# Patient Record
Sex: Female | Born: 1970 | Race: Black or African American | Hispanic: No | Marital: Single | Smoking: Former smoker
Health system: Southern US, Community
[De-identification: ages and names within clinical notes are randomized; demographics above are authoritative.]

## PROBLEM LIST (undated history)

## (undated) DIAGNOSIS — F32A Depression, unspecified: Secondary | ICD-10-CM

## (undated) DIAGNOSIS — R0600 Dyspnea, unspecified: Secondary | ICD-10-CM

## (undated) DIAGNOSIS — F329 Major depressive disorder, single episode, unspecified: Secondary | ICD-10-CM

## (undated) DIAGNOSIS — R7303 Prediabetes: Secondary | ICD-10-CM

## (undated) DIAGNOSIS — I10 Essential (primary) hypertension: Secondary | ICD-10-CM

## (undated) DIAGNOSIS — R519 Headache, unspecified: Secondary | ICD-10-CM

## (undated) DIAGNOSIS — F419 Anxiety disorder, unspecified: Secondary | ICD-10-CM

## (undated) DIAGNOSIS — Z889 Allergy status to unspecified drugs, medicaments and biological substances status: Secondary | ICD-10-CM

## (undated) DIAGNOSIS — E785 Hyperlipidemia, unspecified: Secondary | ICD-10-CM

## (undated) DIAGNOSIS — R002 Palpitations: Secondary | ICD-10-CM

## (undated) DIAGNOSIS — M5030 Other cervical disc degeneration, unspecified cervical region: Secondary | ICD-10-CM

## (undated) HISTORY — PX: WISDOM TOOTH EXTRACTION: SHX21

## (undated) HISTORY — PX: ORBITAL FRACTURE SURGERY: SHX725

## (undated) HISTORY — PX: BACK SURGERY: SHX140

## (undated) HISTORY — PX: CERVICAL DISCECTOMY: SHX98

## (undated) HISTORY — PX: UTERINE FIBROID SURGERY: SHX826

## (undated) HISTORY — PX: LUMBAR FUSION: SHX111

---

## 2017-10-25 ENCOUNTER — Other Ambulatory Visit: Payer: Self-pay | Admitting: Orthopedic Surgery

## 2017-10-31 ENCOUNTER — Encounter (HOSPITAL_COMMUNITY): Payer: Self-pay | Admitting: *Deleted

## 2017-10-31 NOTE — Progress Notes (Signed)
Reports that she is having palpitations for ~ 6 months without associated CP or Shob. Per patient and in PCP listed history she has a history of same. Denies cardiac workup. Anesthesia notified.

## 2017-10-31 NOTE — Progress Notes (Signed)
Anesthesia Chart Review: SAME DAY WORK-UP  Case:  [id number] Date/Time:  11/01/17 1415   Procedure:  POSTERIOR CERVICAL DECOMPRESSION FUSION, CERVICAL 3-4, CERVICAL 4-5, CERVICAL 5-6, CERVICAL 6-7 WITH INSTRUMENTATION AND ALLOGRAFT (N/A )   Anesthesia type:  General   Pre-op diagnosis:  NECK PAIN SECONDARY TO ADJACENT SEGMENT DISC DEGENERATION   Location:  [facility] ROOM 05 / [facility]   Surgeon:  Estill Bamberg, MD      DISCUSSION: Patient is a 47 year old female scheduled for the above procedure.  History includes recent former smoker (quit 09/24/17), HTN, palpitations, C4-7 ACDF '17, L4-5 laminectomy '16.  She denied chest pain and SOB and did not report significant symptoms related to her palpitations. She will get an EKG on arrival due to history of HTN and palpitations. If no worrisome findings and same day labs acceptable then I would anticipate that she can proceed as planned.    VS: Wt 103.9 kg Comment: 3.1.19  LMP 10/26/2017   PROVIDERS: Patient seen by Syliva Overman, PA at [facility] on 03/24/17 for annual exam. BP 122/82 at that time. She was on losartan-HCTZ  LABS: She will get labs on arrival. (Last labs 03/24/17 in [facility].)   EKG: To get day of surgery.    CV: N/A  Past Medical History:  Diagnosis Date  . DDD (degenerative disc disease), cervical   . Depression   . Hypertension   . Palpitations     Past Surgical History:  Procedure Laterality Date  . CERVICAL DISCECTOMY    . LUMBAR FUSION    . UTERINE FIBROID SURGERY      MEDICATIONS: No current facility-administered medications for this encounter.    . cholecalciferol (VITAMIN D) 1000 units tablet  . citalopram (CELEXA) 40 MG tablet  . fluticasone (FLONASE) 50 MCG/ACT nasal spray  . losartan-hydrochlorothiazide (HYZAAR) 50-12.5 MG tablet  . Specialty Vitamins Products (VITAMINS FOR HAIR PO)  . vitamin B-12 (CYANOCOBALAMIN) 1000 MCG tablet  . vitamin E 400 UNIT capsule     Velna Ochs [facility] Short Stay Center/Anesthesiology Phone [phone] 10/31/2017 3:34 PM

## 2017-11-01 ENCOUNTER — Ambulatory Visit (HOSPITAL_COMMUNITY): Payer: No Typology Code available for payment source

## 2017-11-01 ENCOUNTER — Ambulatory Visit (HOSPITAL_COMMUNITY)
Admission: RE | Disposition: A | Discharge status: Home / Self Care | Payer: Self-pay | Source: Ambulatory Visit | Attending: Orthopedic Surgery

## 2017-11-01 ENCOUNTER — Observation Stay (HOSPITAL_COMMUNITY)
Admission: RE | Admit: 2017-11-01 | Discharge: 2017-11-02 | Disposition: A | Discharge status: Home / Self Care | Payer: No Typology Code available for payment source | Source: Ambulatory Visit | Attending: Orthopedic Surgery | Admitting: Orthopedic Surgery

## 2017-11-01 ENCOUNTER — Ambulatory Visit (HOSPITAL_COMMUNITY): Payer: No Typology Code available for payment source | Admitting: Vascular Surgery

## 2017-11-01 ENCOUNTER — Encounter (HOSPITAL_COMMUNITY): Payer: Self-pay | Admitting: *Deleted

## 2017-11-01 DIAGNOSIS — M5410 Radiculopathy, site unspecified: Secondary | ICD-10-CM | POA: Diagnosis present

## 2017-11-01 DIAGNOSIS — Y838 Other surgical procedures as the cause of abnormal reaction of the patient, or of later complication, without mention of misadventure at the time of the procedure: Secondary | ICD-10-CM | POA: Insufficient documentation

## 2017-11-01 DIAGNOSIS — F419 Anxiety disorder, unspecified: Secondary | ICD-10-CM | POA: Insufficient documentation

## 2017-11-01 DIAGNOSIS — F17200 Nicotine dependence, unspecified, uncomplicated: Secondary | ICD-10-CM | POA: Diagnosis not present

## 2017-11-01 DIAGNOSIS — Z79899 Other long term (current) drug therapy: Secondary | ICD-10-CM | POA: Insufficient documentation

## 2017-11-01 DIAGNOSIS — F329 Major depressive disorder, single episode, unspecified: Secondary | ICD-10-CM | POA: Diagnosis not present

## 2017-11-01 DIAGNOSIS — M5011 Cervical disc disorder with radiculopathy,  high cervical region: Secondary | ICD-10-CM | POA: Insufficient documentation

## 2017-11-01 DIAGNOSIS — I10 Essential (primary) hypertension: Secondary | ICD-10-CM | POA: Insufficient documentation

## 2017-11-01 DIAGNOSIS — M960 Pseudarthrosis after fusion or arthrodesis: Secondary | ICD-10-CM | POA: Diagnosis not present

## 2017-11-01 DIAGNOSIS — Z419 Encounter for procedure for purposes other than remedying health state, unspecified: Secondary | ICD-10-CM

## 2017-11-01 DIAGNOSIS — K219 Gastro-esophageal reflux disease without esophagitis: Secondary | ICD-10-CM | POA: Insufficient documentation

## 2017-11-01 DIAGNOSIS — R002 Palpitations: Secondary | ICD-10-CM | POA: Insufficient documentation

## 2017-11-01 DIAGNOSIS — M50321 Other cervical disc degeneration at C4-C5 level: Secondary | ICD-10-CM | POA: Diagnosis present

## 2017-11-01 DIAGNOSIS — Z6838 Body mass index (BMI) 38.0-38.9, adult: Secondary | ICD-10-CM | POA: Diagnosis not present

## 2017-11-01 HISTORY — DX: Other cervical disc degeneration, unspecified cervical region: M50.30

## 2017-11-01 HISTORY — DX: Major depressive disorder, single episode, unspecified: F32.9

## 2017-11-01 HISTORY — DX: Depression, unspecified: F32.A

## 2017-11-01 HISTORY — DX: Palpitations: R00.2

## 2017-11-01 HISTORY — DX: Essential (primary) hypertension: I10

## 2017-11-01 HISTORY — PX: POSTERIOR CERVICAL FUSION/FORAMINOTOMY: SHX5038

## 2017-11-01 LAB — TYPE AND SCREEN
ABO/RH(D): O POS
ANTIBODY SCREEN: NEGATIVE

## 2017-11-01 LAB — CBC WITH DIFFERENTIAL/PLATELET
Abs Immature Granulocytes: 0.01 10*3/uL (ref 0.00–0.07)
Basophils Absolute: 0 10*3/uL (ref 0.0–0.1)
Basophils Relative: 1 %
EOS ABS: 0.2 10*3/uL (ref 0.0–0.5)
Eosinophils Relative: 3 %
HCT: 40.4 % (ref 36.0–46.0)
Hemoglobin: 12.3 g/dL (ref 12.0–15.0)
Immature Granulocytes: 0 %
Lymphocytes Relative: 51 %
Lymphs Abs: 2.9 10*3/uL (ref 0.7–4.0)
MCH: 27 pg (ref 26.0–34.0)
MCHC: 30.4 g/dL (ref 30.0–36.0)
MCV: 88.6 fL (ref 80.0–100.0)
MONO ABS: 0.4 10*3/uL (ref 0.1–1.0)
MONOS PCT: 6 %
NEUTROS PCT: 39 %
Neutro Abs: 2.2 10*3/uL (ref 1.7–7.7)
Platelets: 307 10*3/uL (ref 150–400)
RBC: 4.56 MIL/uL (ref 3.87–5.11)
RDW: 13.8 % (ref 11.5–15.5)
WBC: 5.8 10*3/uL (ref 4.0–10.5)
nRBC: 0 % (ref 0.0–0.2)

## 2017-11-01 LAB — COMPREHENSIVE METABOLIC PANEL
ALT: 21 U/L (ref 0–44)
AST: 22 U/L (ref 15–41)
Albumin: 3.9 g/dL (ref 3.5–5.0)
Alkaline Phosphatase: 74 U/L (ref 38–126)
Anion gap: 10 (ref 5–15)
BUN: 11 mg/dL (ref 6–20)
CHLORIDE: 108 mmol/L (ref 98–111)
CO2: 22 mmol/L (ref 22–32)
Calcium: 9.2 mg/dL (ref 8.9–10.3)
Creatinine, Ser: 0.67 mg/dL (ref 0.44–1.00)
Glucose, Bld: 99 mg/dL (ref 70–99)
POTASSIUM: 3.8 mmol/L (ref 3.5–5.1)
Sodium: 140 mmol/L (ref 135–145)
Total Bilirubin: 0.5 mg/dL (ref 0.3–1.2)
Total Protein: 7.8 g/dL (ref 6.5–8.1)

## 2017-11-01 LAB — ABO/RH: ABO/RH(D): O POS

## 2017-11-01 LAB — URINALYSIS, ROUTINE W REFLEX MICROSCOPIC
Bilirubin Urine: NEGATIVE
Glucose, UA: NEGATIVE mg/dL
Ketones, ur: NEGATIVE mg/dL
Leukocytes, UA: NEGATIVE
Nitrite: NEGATIVE
Protein, ur: NEGATIVE mg/dL
SPECIFIC GRAVITY, URINE: 1.027 (ref 1.005–1.030)
pH: 5 (ref 5.0–8.0)

## 2017-11-01 LAB — PROTIME-INR
INR: 0.98
PROTHROMBIN TIME: 12.9 s (ref 11.4–15.2)

## 2017-11-01 LAB — POCT PREGNANCY, URINE: PREG TEST UR: NEGATIVE

## 2017-11-01 LAB — APTT: APTT: 29 s (ref 24–36)

## 2017-11-01 SURGERY — POSTERIOR CERVICAL FUSION/FORAMINOTOMY LEVEL 4
Anesthesia: General | Site: Neck

## 2017-11-01 MED ORDER — VITAMIN E 180 MG (400 UNIT) PO CAPS
400.0000 [IU] | ORAL_CAPSULE | Freq: Every day | ORAL | Status: DC
  Filled 2017-11-01: qty 1

## 2017-11-01 MED ORDER — SODIUM CHLORIDE 0.9 % IV SOLN
INTRAVENOUS | Status: DC | PRN
  Administered 2017-11-01: 25 ug/min via INTRAVENOUS

## 2017-11-01 MED ORDER — LACTATED RINGERS IV SOLN
INTRAVENOUS | Status: DC | PRN
  Administered 2017-11-01 (×2): via INTRAVENOUS

## 2017-11-01 MED ORDER — FENTANYL CITRATE (PF) 250 MCG/5ML IJ SOLN
INTRAMUSCULAR | Status: AC
  Filled 2017-11-01: qty 5

## 2017-11-01 MED ORDER — FLEET ENEMA 7-19 GM/118ML RE ENEM: 1.0000 | ENEMA | Freq: Once | RECTAL | Status: DC | PRN

## 2017-11-01 MED ORDER — SUCCINYLCHOLINE CHLORIDE 200 MG/10ML IV SOSY
PREFILLED_SYRINGE | INTRAVENOUS | Status: AC
  Filled 2017-11-01: qty 10

## 2017-11-01 MED ORDER — HYDROMORPHONE HCL 1 MG/ML IJ SOLN
INTRAMUSCULAR | Status: AC
  Filled 2017-11-01: qty 1

## 2017-11-01 MED ORDER — BISACODYL 5 MG PO TBEC: 5.0000 mg | DELAYED_RELEASE_TABLET | Freq: Every day | ORAL | Status: DC | PRN

## 2017-11-01 MED ORDER — CITALOPRAM HYDROBROMIDE 20 MG PO TABS: 40.0000 mg | ORAL_TABLET | Freq: Every day | ORAL | Status: DC

## 2017-11-01 MED ORDER — WHITE PETROLATUM EX OINT
TOPICAL_OINTMENT | CUTANEOUS | Status: AC
  Administered 2017-11-02
  Filled 2017-11-01: qty 28.35

## 2017-11-01 MED ORDER — ALBUTEROL SULFATE HFA 108 (90 BASE) MCG/ACT IN AERS
INHALATION_SPRAY | RESPIRATORY_TRACT | Status: DC | PRN
  Administered 2017-11-01: 8 via RESPIRATORY_TRACT
  Administered 2017-11-01: 2 via RESPIRATORY_TRACT

## 2017-11-01 MED ORDER — CEFAZOLIN SODIUM-DEXTROSE 2-4 GM/100ML-% IV SOLN
2.0000 g | INTRAVENOUS | Status: AC
  Administered 2017-11-01 (×2): 2 g via INTRAVENOUS

## 2017-11-01 MED ORDER — SUCCINYLCHOLINE CHLORIDE 200 MG/10ML IV SOSY
PREFILLED_SYRINGE | INTRAVENOUS | Status: DC | PRN
  Administered 2017-11-01: 140 mg via INTRAVENOUS

## 2017-11-01 MED ORDER — PROMETHAZINE HCL 25 MG/ML IJ SOLN
INTRAMUSCULAR | Status: AC
  Administered 2017-11-01: 19:00:00
  Filled 2017-11-01: qty 1

## 2017-11-01 MED ORDER — FLUTICASONE PROPIONATE 50 MCG/ACT NA SUSP
1.0000 | Freq: Every day | NASAL | Status: DC | PRN
  Filled 2017-11-01: qty 16

## 2017-11-01 MED ORDER — SODIUM CHLORIDE 0.9% FLUSH: 3.0000 mL | INTRAVENOUS | Status: DC | PRN

## 2017-11-01 MED ORDER — OXYCODONE-ACETAMINOPHEN 5-325 MG PO TABS
1.0000 | ORAL_TABLET | ORAL | Status: DC | PRN
  Administered 2017-11-01 – 2017-11-02 (×4): 2 via ORAL
  Filled 2017-11-01 (×3): qty 2

## 2017-11-01 MED ORDER — DIAZEPAM 5 MG PO TABS
ORAL_TABLET | ORAL | Status: AC
  Administered 2017-11-01: 20:00:00
  Filled 2017-11-01: qty 1

## 2017-11-01 MED ORDER — LOSARTAN POTASSIUM 50 MG PO TABS: 50.0000 mg | ORAL_TABLET | Freq: Every day | ORAL | Status: DC

## 2017-11-01 MED ORDER — CEFAZOLIN SODIUM-DEXTROSE 2-4 GM/100ML-% IV SOLN
2.0000 g | Freq: Three times a day (TID) | INTRAVENOUS | Status: DC
  Administered 2017-11-02: 2 g via INTRAVENOUS
  Filled 2017-11-01: qty 100

## 2017-11-01 MED ORDER — ACETAMINOPHEN 650 MG RE SUPP: 650.0000 mg | RECTAL | Status: DC | PRN

## 2017-11-01 MED ORDER — SODIUM CHLORIDE 0.9% FLUSH: 3.0000 mL | Freq: Two times a day (BID) | INTRAVENOUS | Status: DC

## 2017-11-01 MED ORDER — 0.9 % SODIUM CHLORIDE (POUR BTL) OPTIME
TOPICAL | Status: DC | PRN
  Administered 2017-11-01: 1000 mL

## 2017-11-01 MED ORDER — DOCUSATE SODIUM 100 MG PO CAPS: 100.0000 mg | ORAL_CAPSULE | Freq: Two times a day (BID) | ORAL | Status: DC

## 2017-11-01 MED ORDER — BUPIVACAINE-EPINEPHRINE 0.25% -1:200000 IJ SOLN
INTRAMUSCULAR | Status: DC | PRN
  Administered 2017-11-01: 20 mL

## 2017-11-01 MED ORDER — PHENYLEPHRINE 40 MCG/ML (10ML) SYRINGE FOR IV PUSH (FOR BLOOD PRESSURE SUPPORT)
PREFILLED_SYRINGE | INTRAVENOUS | Status: DC | PRN
  Administered 2017-11-01: 120 ug via INTRAVENOUS

## 2017-11-01 MED ORDER — PROMETHAZINE HCL 25 MG/ML IJ SOLN
6.2500 mg | INTRAMUSCULAR | Status: DC | PRN
  Administered 2017-11-01: 6.25 mg via INTRAVENOUS

## 2017-11-01 MED ORDER — HYDROCHLOROTHIAZIDE 12.5 MG PO CAPS: 12.5000 mg | ORAL_CAPSULE | Freq: Every day | ORAL | Status: DC

## 2017-11-01 MED ORDER — MEPERIDINE HCL 50 MG/ML IJ SOLN: 6.2500 mg | INTRAMUSCULAR | Status: DC | PRN

## 2017-11-01 MED ORDER — BUPIVACAINE LIPOSOME 1.3 % IJ SUSP
20.0000 mL | INTRAMUSCULAR | Status: DC
  Filled 2017-11-01: qty 20

## 2017-11-01 MED ORDER — LACTATED RINGERS IV SOLN
INTRAVENOUS | Status: DC
  Administered 2017-11-01: 11:00:00 via INTRAVENOUS

## 2017-11-01 MED ORDER — BACITRACIN ZINC 500 UNIT/GM EX OINT
TOPICAL_OINTMENT | CUTANEOUS | Status: DC | PRN
  Administered 2017-11-01: 1 via TOPICAL

## 2017-11-01 MED ORDER — ONDANSETRON HCL 4 MG/2ML IJ SOLN
INTRAMUSCULAR | Status: DC | PRN
  Administered 2017-11-01: 4 mg via INTRAVENOUS

## 2017-11-01 MED ORDER — PROPOFOL 10 MG/ML IV BOLUS
INTRAVENOUS | Status: DC | PRN
  Administered 2017-11-01: 30 mg via INTRAVENOUS
  Administered 2017-11-01: 180 mg via INTRAVENOUS

## 2017-11-01 MED ORDER — ARTIFICIAL TEARS OPHTHALMIC OINT
TOPICAL_OINTMENT | OPHTHALMIC | Status: AC
  Filled 2017-11-01: qty 3.5

## 2017-11-01 MED ORDER — ROCURONIUM BROMIDE 50 MG/5ML IV SOSY
PREFILLED_SYRINGE | INTRAVENOUS | Status: AC
  Filled 2017-11-01: qty 5

## 2017-11-01 MED ORDER — MIDAZOLAM HCL 2 MG/2ML IJ SOLN
INTRAMUSCULAR | Status: DC | PRN
  Administered 2017-11-01 (×2): 1 mg via INTRAVENOUS

## 2017-11-01 MED ORDER — ALBUMIN HUMAN 5 % IV SOLN
INTRAVENOUS | Status: DC | PRN
  Administered 2017-11-01: 17:00:00 via INTRAVENOUS

## 2017-11-01 MED ORDER — MENTHOL 3 MG MT LOZG: 1.0000 | LOZENGE | OROMUCOSAL | Status: DC | PRN

## 2017-11-01 MED ORDER — ACETAMINOPHEN 325 MG PO TABS: 650.0000 mg | ORAL_TABLET | ORAL | Status: DC | PRN

## 2017-11-01 MED ORDER — OXYCODONE-ACETAMINOPHEN 5-325 MG PO TABS
ORAL_TABLET | ORAL | Status: AC
  Filled 2017-11-01: qty 2

## 2017-11-01 MED ORDER — MIDAZOLAM HCL 2 MG/2ML IJ SOLN: 0.5000 mg | Freq: Once | INTRAMUSCULAR | Status: DC | PRN

## 2017-11-01 MED ORDER — LIDOCAINE 2% (20 MG/ML) 5 ML SYRINGE
INTRAMUSCULAR | Status: AC
  Filled 2017-11-01: qty 5

## 2017-11-01 MED ORDER — ROCURONIUM BROMIDE 50 MG/5ML IV SOSY
PREFILLED_SYRINGE | INTRAVENOUS | Status: AC
  Filled 2017-11-01: qty 10

## 2017-11-01 MED ORDER — THROMBIN 20000 UNITS EX SOLR
CUTANEOUS | Status: DC | PRN
  Administered 2017-11-01: 20000 [IU] via TOPICAL

## 2017-11-01 MED ORDER — PHENYLEPHRINE 40 MCG/ML (10ML) SYRINGE FOR IV PUSH (FOR BLOOD PRESSURE SUPPORT)
PREFILLED_SYRINGE | INTRAVENOUS | Status: AC
  Filled 2017-11-01: qty 10

## 2017-11-01 MED ORDER — LIDOCAINE 2% (20 MG/ML) 5 ML SYRINGE
INTRAMUSCULAR | Status: DC | PRN
  Administered 2017-11-01: 40 mg via INTRAVENOUS

## 2017-11-01 MED ORDER — DEXAMETHASONE SODIUM PHOSPHATE 10 MG/ML IJ SOLN
INTRAMUSCULAR | Status: AC
  Filled 2017-11-01: qty 1

## 2017-11-01 MED ORDER — PANTOPRAZOLE SODIUM 20 MG PO TBEC: 20.0000 mg | DELAYED_RELEASE_TABLET | Freq: Two times a day (BID) | ORAL | Status: DC

## 2017-11-01 MED ORDER — LOSARTAN POTASSIUM-HCTZ 50-12.5 MG PO TABS: 1.0000 | ORAL_TABLET | Freq: Every day | ORAL | Status: DC

## 2017-11-01 MED ORDER — DEXAMETHASONE SODIUM PHOSPHATE 10 MG/ML IJ SOLN
INTRAMUSCULAR | Status: DC | PRN
  Administered 2017-11-01: 10 mg via INTRAVENOUS

## 2017-11-01 MED ORDER — VITAMIN B-12 1000 MCG PO TABS
1000.0000 ug | ORAL_TABLET | Freq: Every day | ORAL | Status: DC
  Filled 2017-11-01: qty 1

## 2017-11-01 MED ORDER — ROCURONIUM BROMIDE 10 MG/ML (PF) SYRINGE
PREFILLED_SYRINGE | INTRAVENOUS | Status: DC | PRN
  Administered 2017-11-01: 10 mg via INTRAVENOUS
  Administered 2017-11-01 (×3): 20 mg via INTRAVENOUS
  Administered 2017-11-01: 50 mg via INTRAVENOUS
  Administered 2017-11-01 (×2): 20 mg via INTRAVENOUS
  Administered 2017-11-01 (×2): 10 mg via INTRAVENOUS

## 2017-11-01 MED ORDER — VITAMIN D 1000 UNITS PO TABS: 1000.0000 [IU] | ORAL_TABLET | Freq: Every day | ORAL | Status: DC

## 2017-11-01 MED ORDER — DIAZEPAM 5 MG PO TABS
5.0000 mg | ORAL_TABLET | Freq: Four times a day (QID) | ORAL | Status: DC | PRN
  Administered 2017-11-01 – 2017-11-02 (×2): 5 mg via ORAL
  Filled 2017-11-01: qty 1

## 2017-11-01 MED ORDER — MIDAZOLAM HCL 2 MG/2ML IJ SOLN
INTRAMUSCULAR | Status: AC
  Filled 2017-11-01: qty 2

## 2017-11-01 MED ORDER — HYDROMORPHONE HCL 1 MG/ML IJ SOLN
0.2500 mg | INTRAMUSCULAR | Status: DC | PRN
  Administered 2017-11-01 (×4): 0.5 mg via INTRAVENOUS

## 2017-11-01 MED ORDER — THROMBIN (RECOMBINANT) 20000 UNITS EX SOLR
CUTANEOUS | Status: AC
  Filled 2017-11-01: qty 20000

## 2017-11-01 MED ORDER — SENNOSIDES-DOCUSATE SODIUM 8.6-50 MG PO TABS
1.0000 | ORAL_TABLET | Freq: Every evening | ORAL | Status: DC | PRN
  Filled 2017-11-01: qty 1

## 2017-11-01 MED ORDER — POVIDONE-IODINE 7.5 % EX SOLN
Freq: Once | CUTANEOUS | Status: DC
  Filled 2017-11-01: qty 118

## 2017-11-01 MED ORDER — SUGAMMADEX SODIUM 200 MG/2ML IV SOLN
INTRAVENOUS | Status: DC | PRN
  Administered 2017-11-01: 200 mg via INTRAVENOUS

## 2017-11-01 MED ORDER — PHENOL 1.4 % MT LIQD: 1.0000 | OROMUCOSAL | Status: DC | PRN

## 2017-11-01 MED ORDER — CEFAZOLIN SODIUM-DEXTROSE 2-4 GM/100ML-% IV SOLN
INTRAVENOUS | Status: AC
  Filled 2017-11-01: qty 100

## 2017-11-01 MED ORDER — ZOLPIDEM TARTRATE 5 MG PO TABS: 5.0000 mg | ORAL_TABLET | Freq: Every evening | ORAL | Status: DC | PRN

## 2017-11-01 MED ORDER — ONDANSETRON HCL 4 MG PO TABS: 4.0000 mg | ORAL_TABLET | Freq: Four times a day (QID) | ORAL | Status: DC | PRN

## 2017-11-01 MED ORDER — ALUM & MAG HYDROXIDE-SIMETH 200-200-20 MG/5ML PO SUSP: 30.0000 mL | Freq: Four times a day (QID) | ORAL | Status: DC | PRN

## 2017-11-01 MED ORDER — ALBUTEROL SULFATE HFA 108 (90 BASE) MCG/ACT IN AERS
INHALATION_SPRAY | RESPIRATORY_TRACT | Status: AC
  Filled 2017-11-01: qty 6.7

## 2017-11-01 MED ORDER — FENTANYL CITRATE (PF) 250 MCG/5ML IJ SOLN
INTRAMUSCULAR | Status: DC | PRN
  Administered 2017-11-01: 100 ug via INTRAVENOUS
  Administered 2017-11-01 (×3): 50 ug via INTRAVENOUS

## 2017-11-01 MED ORDER — ONDANSETRON HCL 4 MG/2ML IJ SOLN: 4.0000 mg | Freq: Four times a day (QID) | INTRAMUSCULAR | Status: DC | PRN

## 2017-11-01 MED ORDER — ONDANSETRON HCL 4 MG/2ML IJ SOLN
INTRAMUSCULAR | Status: AC
  Filled 2017-11-01: qty 2

## 2017-11-01 MED ORDER — BUPIVACAINE LIPOSOME 1.3 % IJ SUSP
INTRAMUSCULAR | Status: DC | PRN
  Administered 2017-11-01: 20 mL

## 2017-11-01 MED ORDER — SODIUM CHLORIDE 0.9 % IV SOLN: 250.0000 mL | INTRAVENOUS | Status: DC

## 2017-11-01 MED ORDER — BACITRACIN ZINC 500 UNIT/GM EX OINT
TOPICAL_OINTMENT | CUTANEOUS | Status: AC
  Filled 2017-11-01: qty 28.35

## 2017-11-01 MED ORDER — ARTIFICIAL TEARS OPHTHALMIC OINT
TOPICAL_OINTMENT | OPHTHALMIC | Status: DC | PRN
  Administered 2017-11-01: 1 via OPHTHALMIC

## 2017-11-01 MED ORDER — BUPIVACAINE-EPINEPHRINE (PF) 0.25% -1:200000 IJ SOLN
INTRAMUSCULAR | Status: AC
  Filled 2017-11-01: qty 30

## 2017-11-01 SURGICAL SUPPLY — 87 items
BENZOIN TINCTURE PRP APPL 2/3 (GAUZE/BANDAGES/DRESSINGS) ×3 IMPLANT
BIT DRILL MOUNTAINEER FIX 14 (BIT) ×1
BIT DRILL MOUNTAINEER FIX 14MM (BIT) ×1 IMPLANT
BIT DRILL MOUNTAINER FXED 12MM (DRILL) ×1 IMPLANT
BLADE CLIPPER SURG NEURO (BLADE) IMPLANT
BONE VIVIGEN FORMABLE 5.4CC (Bone Implant) ×3 IMPLANT
BUR NEURO DRILL SOFT 3.0X3.8M (BURR) ×3 IMPLANT
BUR PRESCISION 1.7 ELITE (BURR) ×3 IMPLANT
CLOSURE WOUND 1/2 X4 (GAUZE/BANDAGES/DRESSINGS)
CONT SPEC 4OZ CLIKSEAL STRL BL (MISCELLANEOUS) ×3 IMPLANT
CORDS BIPOLAR (ELECTRODE) ×3 IMPLANT
COVER BACK TABLE 80X110 HD (DRAPES) ×3 IMPLANT
COVER SURGICAL LIGHT HANDLE (MISCELLANEOUS) ×3 IMPLANT
COVER WAND RF STERILE (DRAPES) ×3 IMPLANT
DRAIN CHANNEL 10F 3/8 F FF (DRAIN) IMPLANT
DRAIN CHANNEL 15F RND FF W/TCR (WOUND CARE) IMPLANT
DRAIN HEMOVAC 1/8 X 5 (WOUND CARE) IMPLANT
DRAPE C-ARM 42X72 X-RAY (DRAPES) IMPLANT
DRAPE HALF SHEET 40X57 (DRAPES) ×15 IMPLANT
DRAPE INCISE IOBAN 66X45 STRL (DRAPES) ×3 IMPLANT
DRAPE PED LAPAROTOMY (DRAPES) ×3 IMPLANT
DRAPE POUCH INSTRU U-SHP 10X18 (DRAPES) ×3 IMPLANT
DRAPE SURG 17X23 STRL (DRAPES) ×24 IMPLANT
DRAPE UNIVERSAL PACK (DRAPES) ×3 IMPLANT
DRILL BIT MOUNTAINEER FIX 14MM (BIT) ×2
DRILL MOUNTAINEER FIXED 12MM (DRILL) ×3
DRSG MEPILEX BORDER 4X8 (GAUZE/BANDAGES/DRESSINGS) IMPLANT
DURAPREP 26ML APPLICATOR (WOUND CARE) ×3 IMPLANT
ELECT BLADE 4.0 EZ CLEAN MEGAD (MISCELLANEOUS) ×3
ELECT CAUTERY BLADE 6.4 (BLADE) ×3 IMPLANT
ELECT REM PT RETURN 9FT ADLT (ELECTROSURGICAL) ×3
ELECTRODE BLDE 4.0 EZ CLN MEGD (MISCELLANEOUS) ×1 IMPLANT
ELECTRODE REM PT RTRN 9FT ADLT (ELECTROSURGICAL) ×1 IMPLANT
EVACUATOR SILICONE 100CC (DRAIN) IMPLANT
GAUZE 4X4 16PLY RFD (DISPOSABLE) ×12 IMPLANT
GAUZE SPONGE 4X4 12PLY STRL (GAUZE/BANDAGES/DRESSINGS) ×3 IMPLANT
GLOVE BIO SURGEON STRL SZ7 (GLOVE) ×3 IMPLANT
GLOVE BIO SURGEON STRL SZ8 (GLOVE) ×3 IMPLANT
GLOVE BIOGEL PI IND STRL 7.5 (GLOVE) ×1 IMPLANT
GLOVE BIOGEL PI IND STRL 8 (GLOVE) ×1 IMPLANT
GLOVE BIOGEL PI INDICATOR 7.5 (GLOVE) ×2
GLOVE BIOGEL PI INDICATOR 8 (GLOVE) ×2
GOWN STRL REUS W/ TWL LRG LVL3 (GOWN DISPOSABLE) ×4 IMPLANT
GOWN STRL REUS W/ TWL XL LVL3 (GOWN DISPOSABLE) ×1 IMPLANT
GOWN STRL REUS W/TWL LRG LVL3 (GOWN DISPOSABLE) ×8
GOWN STRL REUS W/TWL XL LVL3 (GOWN DISPOSABLE) ×2
IV CATH 14GX2 1/4 (CATHETERS) ×3 IMPLANT
KIT BASIN OR (CUSTOM PROCEDURE TRAY) ×3 IMPLANT
KIT TURNOVER KIT B (KITS) ×3 IMPLANT
NEEDLE HYPO 25GX1X1/2 BEV (NEEDLE) ×3 IMPLANT
NEEDLE PRECISIONGLIDE 27X1.5 (NEEDLE) ×3 IMPLANT
NS IRRIG 1000ML POUR BTL (IV SOLUTION) ×3 IMPLANT
NUT HH X CONN OUTER (Orthopedic Implant) ×6 IMPLANT
PACK LAMINECTOMY ORTHO (CUSTOM PROCEDURE TRAY) ×3 IMPLANT
PAD ARMBOARD 7.5X6 YLW CONV (MISCELLANEOUS) ×6 IMPLANT
PATTIES SURGICAL .5 X.5 (GAUZE/BANDAGES/DRESSINGS) ×3 IMPLANT
PATTIES SURGICAL .5 X3 (DISPOSABLE) IMPLANT
PATTIES SURGICAL .5X1.5 (GAUZE/BANDAGES/DRESSINGS) IMPLANT
PIN MAYFIELD SKULL DISP (PIN) ×3 IMPLANT
PLATE HH X CONN 28MM (Plate) ×3 IMPLANT
PUTTY DBX 1CC (Putty) ×3 IMPLANT
PUTTY DBX 1CC DEPUY (Putty) ×1 IMPLANT
ROD MOUTAINEER 3.5X200MM (Rod) ×3 IMPLANT
ROD TEMPLATE MOUNTAINEER 200 (MISCELLANEOUS) ×6 IMPLANT
SCREW F A 3.5X12 (Screw) ×3 IMPLANT
SCREW F A 3.5X14 (Screw) ×15 IMPLANT
SCREW HH X CONN INNER (Screw) ×6 IMPLANT
SCREW INNER (Screw) ×24 IMPLANT
SCREW POLY MOUNTAINEER 3.5X26 (Screw) ×6 IMPLANT
SPONGE INTESTINAL PEANUT (DISPOSABLE) ×3 IMPLANT
SPONGE SURGIFOAM ABS GEL 100 (HEMOSTASIS) ×3 IMPLANT
STRIP CLOSURE SKIN 1/2X4 (GAUZE/BANDAGES/DRESSINGS) IMPLANT
SURGIFLO W/THROMBIN 8M KIT (HEMOSTASIS) ×3 IMPLANT
SUT MNCRL AB 4-0 PS2 18 (SUTURE) ×3 IMPLANT
SUT VIC AB 0 CT1 18XCR BRD 8 (SUTURE) ×1 IMPLANT
SUT VIC AB 0 CT1 8-18 (SUTURE) ×2
SUT VIC AB 1 CT1 18XCR BRD 8 (SUTURE) ×2 IMPLANT
SUT VIC AB 1 CT1 8-18 (SUTURE) ×4
SUT VIC AB 2-0 CT2 18 VCP726D (SUTURE) ×3 IMPLANT
SYR BULB IRRIGATION 50ML (SYRINGE) ×3 IMPLANT
SYR CONTROL 10ML LL (SYRINGE) ×3 IMPLANT
TAPE CLOTH 4X10 WHT NS (GAUZE/BANDAGES/DRESSINGS) ×3 IMPLANT
TOWEL OR 17X24 6PK STRL BLUE (TOWEL DISPOSABLE) ×3 IMPLANT
TOWEL OR 17X26 10 PK STRL BLUE (TOWEL DISPOSABLE) ×3 IMPLANT
TRAY FOLEY MTR SLVR 16FR STAT (SET/KITS/TRAYS/PACK) ×3 IMPLANT
WATER STERILE IRR 1000ML POUR (IV SOLUTION) ×3 IMPLANT
YANKAUER SUCT BULB TIP NO VENT (SUCTIONS) ×3 IMPLANT

## 2017-11-01 NOTE — Op Note (Signed)
NAME: Sherry Daniel  MEDICAL RECORD NO: [id number]  DATE OF BIRTH: September 01, 1970  FACILITY: [facility]  [facility] Noreene Larsson, MD         OPERATIVE REPORT  DATE OF PROCEDURE:  11/01/2017  PREOPERATIVE DIAGNOSES: 1.  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  Severe neck pain beginning at the time of a work injury dated 10/04/2016  POSTOPERATIVE DIAGNOSES: 1.  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  Severe neck pain beginning at the time of a work injury dated 10/04/2016  PROCEDURE: 1.  Posterior spinal fusion C3-C4, C4-C5, C5-C6, C6-C7. 2.  Placement of posterior segmental instrumentation C3-C7. 3.  Bilateral partial facetectomy and foraminotomy, C6-7 4.  Use of local autograft. 5.  Use of morselized allograft--Vivigen, DBX putty. 6.  Cranial tong application and removal. 7.  Intraoperative use of fluoroscopy.  SURGEON:  Estill Bamberg, MD  ASSISTANT:  Jason Coop, PA-C   ANESTHESIA:  General endotracheal anesthesia.  COMPLICATIONS:  None.  DISPOSITION:  Stable.  ESTIMATED BLOOD LOSS:  100 cc.  INDICATIONS FOR SURGERY:  Briefly, the patient is a very pleasant 47 year old female who is status post the surgery as noted above.  She then had a work injury on 10/04/2016.  This did result in ongoing and significant pain in her neck.  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  Given her ongoing pain and lack of improvement with appropriate conservative treatment measures, we did discuss proceeding with the procedure noted above.  The patient did wish to proceed.  OPERATIVE DETAILS:  On 11/01/2017, the patient was brought to surgery and general endotracheal anesthesia was administered.  A Mayfield head holder was applied  by me.  The patient was then rolled prone, and the Mayfield headholder was secured to the bed  with the head positioned into the appropriate degree of lordosis.  The patient's arms were secured to her sides, and all bony prominences were padded.  The neck was then prepped and draped posteriorly in the usual fashion.  A timeout procedure was  performed.  At this point, I did make a midline incision.  The fascia was incised at the midline.  The paraspinal musculature was bluntly swept laterally, and the posterior elements of C3, C4, C5, and C6 and C7 were identified and subperiosteally exposed.  The facet joints to be fused were subperiosteally exposed as well.  I did use a 1.7 mm high-speed burr to decorticate the facet joints at C3-C4, C4-C5, C5-C6, and C6-7 bilaterally.  A 3 mm bur was used to decorticate the posterior elements across these levels as  well.  Using anatomic landmarks, I did use a 1.7 mm bur to prepare the entry point of the lateral mass screws at C3, C4, and C5 bilaterally. A 3 mm tap was used, and 3.5 x 14 mm screws were secured into the lateral masses of C3, C4 and C5, bilaterally, with the exception of C5 on the right, where a 12 mm screw was used. At the C6-7 level, I did perform laminotomies and partial facetectomies, and did follow the exiting C7 nerve roots at the foramen, and did entirely decompress the bilateral C7 nerves.  This did also allow me the ability to palpate the medial and superior border of the C7 pedicles.  Using these landmarks, I did use 1.7 mm bur to prepare the entry point of the C7 pedicle screws.  I then used an awl and a 3.5 mm tap.  A ball-tipped probe was used to confirm that there is no cortical violation of the tap.  At this point, 3.5 x 26 mm screws were advanced into the C7 pedicles bilaterally.  A combination of DBX putty and Vivigen was then placed along the posterior elements and facet joints to be fused.  Autograft taken from the spinous processes was also  packed posteriorly.  A rod was then contoured into the appropriate degree of lordosis  and secured into the tulip heads of the screws bilaterally.  Caps were then placed, and a final locking procedure was performed.  A cross-link was placed over the C5 lateral mass screws. Prior to placing the bone graft, I did liberally irrigate the wound with a total of approximately 2 L of normal saline.  I did use AP and lateral fluoroscopy to ensure appropriate positioning of the screws, which I was very happy with.  At this point, the wound was closed in layers using #1 Vicryl, followed by 2-0 Vicryl, followed by 4-0 Monocryl.  Benzoin and Steri-Strips were applied, followed by sterile dressing.  All instrument counts were correct at the termination of the procedure.  The patient was then rolled into the supine position, and the Mayfield head holder was removed uneventfully.  Of note, Jason Coop was my assistant throughout surgery and did aid in retraction, suctioning, and closure from start to finish.  Estill Bamberg, MD

## 2017-11-01 NOTE — Anesthesia Postprocedure Evaluation (Signed)
Anesthesia Post Note  Patient: Statistician  Procedure(s) Performed: POSTERIOR CERVICAL DECOMPRESSION FUSION, CERVICAL 3-4, CERVICAL 4-5, CERVICAL 5-6, CERVICAL 6-7 WITH INSTRUMENTATION AND ALLOGRAFT (N/A Neck)     Patient location during evaluation: PACU Anesthesia Type: General Level of consciousness: awake and alert Pain management: pain level controlled Vital Signs Assessment: post-procedure vital signs reviewed and stable Respiratory status: spontaneous breathing, nonlabored ventilation, respiratory function stable and patient connected to nasal cannula oxygen Cardiovascular status: blood pressure returned to baseline and stable Postop Assessment: no apparent nausea or vomiting Anesthetic complications: no    Last Vitals:  Vitals:   11/01/17 1953 11/01/17 2018  BP: 131/81 138/80  Pulse: 93 92  Resp: 12 18  Temp:  36.8 C  SpO2: 97% 100%    Last Pain:  Vitals:   11/01/17 2024  TempSrc:   PainSc: Asleep                 Ralphine Hinks COKER

## 2017-11-01 NOTE — H&P (Signed)
PREOPERATIVE H&P  Chief Complaint: Neck pain  HPI: Sherry Daniel is a 47 y.o. female who presents with ongoing pain in the neck. Patient is s/p a previous ACDF which did go on to nonunions. Patient also had significant degenerative changes occur at C3/4.  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 DDD (degenerative disc disease), cervical   . Depression   . Hypertension   . Palpitations    Past Surgical History:  Procedure Laterality Date  . CERVICAL DISCECTOMY    . LUMBAR FUSION    . UTERINE FIBROID SURGERY     Social History   Socioeconomic History  . Marital status: Single    Spouse name: Not on file  . Number of children: Not on file  . Years of education: Not on file  . Highest education level: Not on file  Occupational History  . Not on file  Social Needs  . Financial resource strain: Not on file  . Food insecurity:    Worry: Not on file    Inability: Not on file  . Transportation needs:    Medical: Not on file    Non-medical: Not on file  Tobacco Use  . Smoking status: Former Smoker    Last attempt to quit: 09/24/2017    Years since quitting: 0.1  . Smokeless tobacco: Never Used  . Tobacco comment: reports she smoked mainly on the weekend socially  Substance and Sexual Activity  . Alcohol use: Yes    Comment: 1 to 2 days a month socially   . Drug use: Never  . Sexual activity: Not on file  Lifestyle  . Physical activity:    Days per week: Not on file    Minutes per session: Not on file  . Stress: Not on file  Relationships  . Social connections:    Talks on phone: Not on file    Gets together: Not on file    Attends religious service: Not on file    Active member of club or organization: Not on file    Attends meetings of clubs  or organizations: Not on file    Relationship status: Not on file  Other Topics Concern  . Not on file  Social History Narrative  . Not on file   History reviewed. No pertinent family history. No Known Allergies Prior to Admission medications   Medication Sig Start Date End Date Taking? Authorizing Provider  cholecalciferol (VITAMIN D) 1000 units tablet Take 1,000 Units by mouth daily.   Yes [provider]  citalopram (CELEXA) 40 MG tablet Take 1 tablet by mouth daily. 09/21/17  Yes [provider]  fluticasone (FLONASE) 50 MCG/ACT nasal spray Place 1 spray into both nostrils daily as needed for allergies or rhinitis.   Yes [provider]  losartan-hydrochlorothiazide (HYZAAR) 50-12.5 MG tablet Take 1 tablet by mouth daily. 09/11/17  Yes [provider]  Specialty Vitamins Products (VITAMINS FOR HAIR PO) Take 2 tablets by mouth daily.   Yes [provider]  vitamin B-12 (CYANOCOBALAMIN) 1000 MCG tablet Take 1,000 mcg by mouth daily.   Yes [provider]  vitamin E 400 UNIT capsule Take 400 Units by mouth daily.   Yes [provider]     All other systems have  been reviewed and were otherwise negative with the exception of those mentioned in the HPI and as above.  Physical Exam: There were no vitals filed for this visit.  There is no height or weight on file to calculate BMI.  General: Alert, no acute distress Cardiovascular: No pedal edema Respiratory: No cyanosis, no use of accessory musculature Skin: No lesions in the area of chief complaint Neurologic: Sensation intact distally Psychiatric: Patient is competent for consent with normal mood and affect Lymphatic: No axillary or cervical lymphadenopathy  MUSCULOSKELETAL: + TTP posterior in cervical region with pain noted with neck ROM  Assessment/Plan: NECK PAIN SECONDARY TO NONUNIONS C4-7 AND C3/4 ADJACENT SEGMENT DISC DEGENERATION Plan for  Procedure(s): POSTERIOR CERVICAL DECOMPRESSION FUSION, CERVICAL 3-4, CERVICAL 4-5, CERVICAL 5-6, CERVICAL 6-7 WITH INSTRUMENTATION AND ALLOGRAFT   Emilee Hero, MD 11/01/2017 8:33 AM

## 2017-11-01 NOTE — Anesthesia Procedure Notes (Signed)
Procedure Name: Intubation Date/Time: 11/01/2017 1:49 PM Performed by: Alvera Novel, CRNA Pre-anesthesia Checklist: Patient identified, Emergency Drugs available, Suction available and Patient being monitored Patient Re-evaluated:Patient Re-evaluated prior to induction Oxygen Delivery Method: Circle System Utilized Preoxygenation: Pre-oxygenation with 100% oxygen Induction Type: IV induction Ventilation: Mask ventilation with difficulty Laryngoscope Size: Glidescope and 3 Grade View: Grade I Tube type: Oral Tube size: 7.0 mm Number of attempts: 1 Airway Equipment and Method: Stylet and Oral airway Placement Confirmation: ETT inserted through vocal cords under direct vision,  positive ETCO2 and breath sounds checked- equal and bilateral Secured at: 22 cm Tube secured with: Tape Dental Injury: Teeth and Oropharynx as per pre-operative assessment  Difficulty Due To: Difficulty was anticipated, Difficult Airway- due to large tongue, Difficult Airway- due to reduced neck mobility and Difficult Airway- due to limited oral opening Comments: Pt has reduced neck mobility due to previous ACDF. Large tounge and small mouth opening. Grade 1 view with glidescope 3.

## 2017-11-01 NOTE — Anesthesia Preprocedure Evaluation (Signed)
Anesthesia Evaluation  Patient identified by MRN, date of birth, ID band Patient awake    Reviewed: Allergy & Precautions, NPO status , Patient's Chart, lab work & pertinent test results  History of Anesthesia Complications Negative for: history of anesthetic complications  Airway Mallampati: IV  TM Distance: >3 FB Neck ROM: Full  Mouth opening: Limited Mouth Opening  Dental  (+) Dental Advisory Given   Pulmonary Current Smoker,    breath sounds clear to auscultation       Cardiovascular hypertension, Pt. on medications (-) angina Rhythm:Regular Rate:Normal     Neuro/Psych Anxiety Depression    GI/Hepatic Neg liver ROS, GERD  Controlled,  Endo/Other  Morbid obesity  Renal/GU negative Renal ROS     Musculoskeletal   Abdominal (+) + obese,   Peds  Hematology negative hematology ROS (+)   Anesthesia Other Findings   Reproductive/Obstetrics                             Anesthesia Physical Anesthesia Plan  ASA: II  Anesthesia Plan: General   Post-op Pain Management:    Induction: Intravenous  PONV Risk Score and Plan: 3 and Dexamethasone and Ondansetron  Airway Management Planned: Oral ETT and Video Laryngoscope Planned  Additional Equipment:   Intra-op Plan:   Post-operative Plan: Extubation in OR  Informed Consent: I have reviewed the patients History and Physical, chart, labs and discussed the procedure including the risks, benefits and alternatives for the proposed anesthesia with the patient or authorized representative who has indicated his/her understanding and acceptance.   Dental advisory given  Plan Discussed with: Surgeon and CRNA  Anesthesia Plan Comments: (Plan routine monitors GETA with VideoGlide intubation)        Anesthesia Quick Evaluation

## 2017-11-01 NOTE — Transfer of Care (Signed)
Immediate Anesthesia Transfer of Care Note  Patient: Sherry Daniel  Procedure(s) Performed: POSTERIOR CERVICAL DECOMPRESSION FUSION, CERVICAL 3-4, CERVICAL 4-5, CERVICAL 5-6, CERVICAL 6-7 WITH INSTRUMENTATION AND ALLOGRAFT (N/A Neck)  Patient Location: PACU  Anesthesia Type:General  Level of Consciousness: awake, alert  and oriented  Airway & Oxygen Therapy: Patient Spontanous Breathing  Post-op Assessment: Report given to RN and Patient moving all extremities X 4  Post vital signs: Reviewed and stable  Last Vitals:  Vitals Value Taken Time  BP 131/109 11/01/2017  6:38 PM  Temp    Pulse 93 11/01/2017  6:39 PM  Resp 20 11/01/2017  6:39 PM  SpO2 97 % 11/01/2017  6:39 PM  Vitals shown include unvalidated device data.  Last Pain:  Vitals:   11/01/17 1026  TempSrc:   PainSc: 6       Patients Stated Pain Goal: 5 (11/01/17 1026)  Complications: No apparent anesthesia complications

## 2017-11-02 DIAGNOSIS — M960 Pseudarthrosis after fusion or arthrodesis: Secondary | ICD-10-CM | POA: Diagnosis not present

## 2017-11-02 MED FILL — Thrombin (Recombinant) For Soln 20000 Unit: CUTANEOUS | Qty: 1 | Status: AC

## 2017-11-02 NOTE — Progress Notes (Signed)
Pt doing well. Pt given D/C instructions with Rx's, verbal understanding was provided. Pt's incision is clean and dry with no sign of infection. Pt's IV was removed prior to D/C. Pt D/C'd home via wheelchair per MD order. Pt is stable @ D/C and has no other needs at this time. Fleurette Woolbright, RN  °

## 2017-11-02 NOTE — Progress Notes (Signed)
    Patient doing well  Denies arm pain Tolerating PO well Neck pain minimal   Physical Exam: Vitals:   11/01/17 2300 11/02/17 0312  BP: 133/88 116/81  Pulse: 94 86  Resp: 20 18  Temp: 97.8 F (36.6 C) 98.2 F (36.8 C)  SpO2: 100% 96%    Collar is well-applied Dressing in place NVI  POD #1 s/p PSF C3-C7, doing well  - encourage ambulation - Percocet for pain, Valium for muscle spasms - d/c home today with f/u in 2 weeks

## 2017-11-03 ENCOUNTER — Encounter (HOSPITAL_COMMUNITY): Payer: Self-pay | Admitting: Orthopedic Surgery

## 2017-11-13 ENCOUNTER — Encounter (HOSPITAL_COMMUNITY): Payer: Self-pay | Admitting: Orthopedic Surgery

## 2017-11-16 NOTE — Discharge Summary (Signed)
Patient ID: Sherry Daniel MRN: [id number] DOB/AGE: [birth year] 49 y.o.  Admit date: 11/01/2017 Discharge date: 11/02/2017  Admission Diagnoses:  Active Problems:   Radiculopathy   Discharge Diagnoses:  Same  Past Medical History:  Diagnosis Date  . DDD (degenerative disc disease), cervical   . Depression   . Hypertension   . Palpitations     Surgeries: Procedure(s): POSTERIOR CERVICAL DECOMPRESSION FUSION, CERVICAL 3-4, CERVICAL 4-5, CERVICAL 5-6, CERVICAL 6-7 WITH INSTRUMENTATION AND ALLOGRAFT on 11/01/2017   Consultants: None  Discharged Condition: Improved  Hospital Course: Sherry Daniel is an 46 y.o. female who was admitted 11/01/2017 for operative treatment of radiculopathy. Patient has severe unremitting pain that affects sleep, daily activities, and work/hobbies. After pre-op clearance the patient was taken to the operating room on 11/01/2017 and underwent  Procedure(s): POSTERIOR CERVICAL DECOMPRESSION FUSION, CERVICAL 3-4, CERVICAL 4-5, CERVICAL 5-6, CERVICAL 6-7 WITH INSTRUMENTATION AND ALLOGRAFT.    Patient was given perioperative antibiotics:  Anti-infectives (From admission, onward)   Start     Dose/Rate Route Frequency Ordered Stop   11/02/17 0200  ceFAZolin (ANCEF) IVPB 2g/100 mL premix  Status:  Discontinued     2 g 200 mL/hr over 30 Minutes Intravenous Every 8 hours 11/01/17 2017 11/02/17 1506   11/01/17 1300  ceFAZolin (ANCEF) IVPB 2g/100 mL premix     2 g 200 mL/hr over 30 Minutes Intravenous On call to O.R. 11/01/17 1020 11/01/17 1752   11/01/17 1018  ceFAZolin (ANCEF) 2-4 GM/100ML-% IVPB    Note to Pharmacy:  Sandi Raveling   : cabinet override      11/01/17 1018 11/01/17 1353       Patient was given sequential compression devices, early ambulation to prevent DVT.  Patient benefited maximally from hospital stay and there were no complications.    Recent vital signs: BP (!) 143/89 (BP Location: Right Arm)   Pulse 84   Temp 97.8 F (36.6  C) (Oral)   Resp 18   Ht 5\' 6"  (1.676 m)   Wt 108.9 kg   LMP 10/26/2017   SpO2 99%   BMI 38.74 kg/m    Discharge Medications:   Allergies as of 11/02/2017   No Known Allergies     Medication List    TAKE these medications   cholecalciferol 1000 units tablet Commonly known as:  VITAMIN D Take 1,000 Units by mouth daily.   citalopram 40 MG tablet Commonly known as:  CELEXA Take 1 tablet by mouth daily.   fluticasone 50 MCG/ACT nasal spray Commonly known as:  FLONASE Place 1 spray into both nostrils daily as needed for allergies or rhinitis.   losartan-hydrochlorothiazide 50-12.5 MG tablet Commonly known as:  HYZAAR Take 1 tablet by mouth daily.   vitamin B-12 1000 MCG tablet Commonly known as:  CYANOCOBALAMIN Take 1,000 mcg by mouth daily.   vitamin E 400 UNIT capsule Take 400 Units by mouth daily.   VITAMINS FOR HAIR PO Take 2 tablets by mouth daily.       Diagnostic Studies: Dg Cervical Spine 1 View  Result Date: 11/01/2017 CLINICAL DATA:  Posterior cervical fusion, opening pictures. EXAM: DG CERVICAL SPINE - 1 VIEW COMPARISON:  None. FINDINGS: A single lateral cross-table view of the cervical spine acquired intraoperatively demonstrates a curved probe projecting over the spinous process of C5. Pre-existing ACDF from C4-C7 is noted. IMPRESSION: Localizing marker projects over the spinous process of C5. Electronically Signed   By: Tollie Eth M.D.   On: 11/01/2017 19:38  Dg Cervical Spine 2-3 Views  Result Date: 11/01/2017 CLINICAL DATA:  Cervical fusion EXAM: CERVICAL SPINE - 2-3 VIEW; DG C-ARM 61-120 MIN COMPARISON:  Intraoperative radiographs from 11/01/2017 at 2:52 p.m. FINDINGS: Frontal and lateral images demonstrate posterolateral rod and facet screw fixator at C3-C4-C5-C7. The patient also has an anterior plate and screw fixator at C4-C5-C6-C7. Suboptimal visualization of vertebral cortex at the C4 level and below due to the patient's shoulders. IMPRESSION:  1. Intraoperative images demonstrate posterolateral rod and facet screw fixator at C3-C4-C5-C7 and pre-existing anterior plate and screw fixator as noted above. Electronically Signed   By: Gaylyn Rong M.D.   On: 11/01/2017 19:49   Dg C-arm 1-60 Min  Result Date: 11/01/2017 CLINICAL DATA:  Cervical fusion EXAM: CERVICAL SPINE - 2-3 VIEW; DG C-ARM 61-120 MIN COMPARISON:  Intraoperative radiographs from 11/01/2017 at 2:52 p.m. FINDINGS: Frontal and lateral images demonstrate posterolateral rod and facet screw fixator at C3-C4-C5-C7. The patient also has an anterior plate and screw fixator at C4-C5-C6-C7. Suboptimal visualization of vertebral cortex at the C4 level and below due to the patient's shoulders. IMPRESSION: 1. Intraoperative images demonstrate posterolateral rod and facet screw fixator at C3-C4-C5-C7 and pre-existing anterior plate and screw fixator as noted above. Electronically Signed   By: Gaylyn Rong M.D.   On: 11/01/2017 19:49    Disposition:    POD #1 s/p PSF C3-C7, doing well  - encourage ambulation - Percocet for pain, Valium for muscle spasms - d/c home today with f/u in 2 weeks -Written scripts for pain signed and in chart -D/C instructions sheet printed and in chart  Signed: Eilene Ghazi Liyah Higham 11/16/2017, 9:12 AM

## 2018-08-29 ENCOUNTER — Other Ambulatory Visit: Payer: Self-pay | Admitting: Orthopedic Surgery

## 2018-09-28 NOTE — Pre-Procedure Instructions (Signed)
Anwar Crill  09/28/2018      [id number] [place] [address], [place] [address] [place] [address] Phone: [phone] Fax: [phone]    Your procedure is scheduled on 10/04/18.  Report to [facility] Admitting at [phone] A.M.  Call this number if you have problems the morning of surgery:  [phone]   Remember:  Do not eat or drink after midnight.  You may drink clear liquids until 915AM .  Clear liquids allowed are:                    Water, Carbonated beverages, Clear Tea and Black Coffee only    Take these medicines the morning of surgery with A SIP OF WATER ---Versie Starks,    Do not wear jewelry, make-up or nail polish.  Do not wear lotions, powders, or perfumes, or deodorant.  Do not shave 48 hours prior to surgery.  Men may shave face and neck.  Do not bring valuables to the hospital.  [facility] is not responsible for any belongings or valuables.  Contacts, dentures or bridgework may not be worn into surgery.  Leave your suitcase in the car.  After surgery it may be brought to your room.  For patients admitted to the hospital, discharge time will be determined by your treatment team.  Patients discharged the day of surgery will not be allowed to drive home.    Special instructions:  Do not take any aspirin,anti-inflammatories,vitamins,or herbal supplements 5-7 days prior to surgery. Please complete your PRE-SURGERY ENSURE that was provided to you by ... the morning of surgery.  Please, if able, drink it in one setting. DO NOT SIP.[facility] - Preparing for Surgery  Before surgery, you can play an important role.  Because skin is not sterile, your skin needs to be as free of germs as possible.  You can reduce the number of germs on you skin by washing with CHG (chlorahexidine gluconate) soap before surgery.  CHG is an antiseptic cleaner which kills germs  and bonds with the skin to continue killing germs even after washing.  Oral Hygiene is also important in reducing the risk of infection.  Remember to brush your teeth with your regular toothpaste the morning of surgery.  Please DO NOT use if you have an allergy to CHG or antibacterial soaps.  If your skin becomes reddened/irritated stop using the CHG and inform your nurse when you arrive at Short Stay.  Do not shave (including legs and underarms) for at least 48 hours prior to the first CHG shower.  You may shave your face.  Please follow these instructions carefully:   1.  Shower with CHG Soap the night before surgery and the morning of Surgery.  2.  If you choose to wash your hair, wash your hair first as usual with your normal shampoo.  3.  After you shampoo, rinse your hair and body thoroughly to remove the shampoo. 4.  Use CHG as you would any other liquid soap.  You can apply chg directly to the skin and wash gently with a      scrungie or washcloth.           5.  Apply the CHG Soap to your body ONLY FROM THE NECK DOWN.   Do not use on open wounds or open sores. Avoid contact with your eyes, ears, mouth and genitals (private  parts).  Wash genitals (private parts) with your normal soap.  6.  Wash thoroughly, paying special attention to the area where your surgery will be performed.  7.  Thoroughly rinse your body with warm water from the neck down.  8.  DO NOT shower/wash with your normal soap after using and rinsing off the CHG Soap.  9.  Pat yourself dry with a clean towel.            10.  Wear clean pajamas.            11.  Place clean sheets on your bed the night of your first shower and do not sleep with pets.  Day of Surgery  Do not apply any lotions/deoderants the morning of surgery.   Please wear clean clothes to the hospital/surgery center. Remember to brush your teeth with toothpaste.   Please read over the following fact sheets that you were given. MRSA  Information

## 2018-10-02 ENCOUNTER — Encounter (HOSPITAL_COMMUNITY)
Admission: RE | Admit: 2018-10-02 | Discharge: 2018-10-02 | Disposition: A | Discharge status: Home / Self Care | Payer: No Typology Code available for payment source | Source: Ambulatory Visit | Attending: Orthopedic Surgery | Admitting: Orthopedic Surgery

## 2018-10-02 ENCOUNTER — Other Ambulatory Visit: Payer: Self-pay

## 2018-10-02 ENCOUNTER — Other Ambulatory Visit (HOSPITAL_COMMUNITY)
Admission: RE | Admit: 2018-10-02 | Discharge: 2018-10-02 | Disposition: A | Discharge status: Home / Self Care | Payer: HRSA Program | Source: Ambulatory Visit | Attending: Orthopedic Surgery | Admitting: Orthopedic Surgery

## 2018-10-02 ENCOUNTER — Encounter (HOSPITAL_COMMUNITY): Payer: Self-pay

## 2018-10-02 DIAGNOSIS — M48061 Spinal stenosis, lumbar region without neurogenic claudication: Secondary | ICD-10-CM | POA: Insufficient documentation

## 2018-10-02 DIAGNOSIS — Z20828 Contact with and (suspected) exposure to other viral communicable diseases: Secondary | ICD-10-CM | POA: Insufficient documentation

## 2018-10-02 DIAGNOSIS — Z01812 Encounter for preprocedural laboratory examination: Secondary | ICD-10-CM | POA: Diagnosis not present

## 2018-10-02 DIAGNOSIS — M5136 Other intervertebral disc degeneration, lumbar region: Secondary | ICD-10-CM | POA: Insufficient documentation

## 2018-10-02 HISTORY — DX: Anxiety disorder, unspecified: F41.9

## 2018-10-02 HISTORY — DX: Headache, unspecified: R51.9

## 2018-10-02 LAB — URINALYSIS, ROUTINE W REFLEX MICROSCOPIC
Bilirubin Urine: NEGATIVE
Glucose, UA: NEGATIVE mg/dL
Hgb urine dipstick: NEGATIVE
Ketones, ur: NEGATIVE mg/dL
Leukocytes,Ua: NEGATIVE
Nitrite: NEGATIVE
Protein, ur: NEGATIVE mg/dL
Specific Gravity, Urine: 1.021 (ref 1.005–1.030)
pH: 5 (ref 5.0–8.0)

## 2018-10-02 LAB — CBC WITH DIFFERENTIAL/PLATELET
Abs Immature Granulocytes: 0.01 10*3/uL (ref 0.00–0.07)
Basophils Absolute: 0 10*3/uL (ref 0.0–0.1)
Basophils Relative: 1 %
Eosinophils Absolute: 0.2 10*3/uL (ref 0.0–0.5)
Eosinophils Relative: 4 %
HCT: 39.8 % (ref 36.0–46.0)
Hemoglobin: 13 g/dL (ref 12.0–15.0)
Immature Granulocytes: 0 %
Lymphocytes Relative: 49 %
Lymphs Abs: 2.8 10*3/uL (ref 0.7–4.0)
MCH: 29 pg (ref 26.0–34.0)
MCHC: 32.7 g/dL (ref 30.0–36.0)
MCV: 88.6 fL (ref 80.0–100.0)
Monocytes Absolute: 0.3 10*3/uL (ref 0.1–1.0)
Monocytes Relative: 5 %
Neutro Abs: 2.3 10*3/uL (ref 1.7–7.7)
Neutrophils Relative %: 41 %
Platelets: 311 10*3/uL (ref 150–400)
RBC: 4.49 MIL/uL (ref 3.87–5.11)
RDW: 13.3 % (ref 11.5–15.5)
WBC: 5.7 10*3/uL (ref 4.0–10.5)
nRBC: 0 % (ref 0.0–0.2)

## 2018-10-02 LAB — COMPREHENSIVE METABOLIC PANEL
ALT: 29 U/L (ref 0–44)
AST: 32 U/L (ref 15–41)
Albumin: 4.2 g/dL (ref 3.5–5.0)
Alkaline Phosphatase: 100 U/L (ref 38–126)
Anion gap: 10 (ref 5–15)
BUN: 9 mg/dL (ref 6–20)
CO2: 23 mmol/L (ref 22–32)
Calcium: 9.7 mg/dL (ref 8.9–10.3)
Chloride: 105 mmol/L (ref 98–111)
Creatinine, Ser: 0.9 mg/dL (ref 0.44–1.00)
GFR calc Af Amer: >60 mL/min (ref >60)
GFR calc non Af Amer: >60 mL/min (ref >60)
Glucose, Bld: 105 mg/dL — ABNORMAL HIGH (ref 70–99)
Potassium: 4.1 mmol/L (ref 3.5–5.1)
Sodium: 138 mmol/L (ref 135–145)
Total Bilirubin: 0.4 mg/dL (ref 0.3–1.2)
Total Protein: 8.4 g/dL — ABNORMAL HIGH (ref 6.5–8.1)

## 2018-10-02 LAB — SURGICAL PCR SCREEN
MRSA, PCR: NEGATIVE
Staphylococcus aureus: NEGATIVE

## 2018-10-02 LAB — PROTIME-INR
INR: 1 (ref 0.8–1.2)
Prothrombin Time: 12.7 seconds (ref 11.4–15.2)

## 2018-10-02 LAB — SARS CORONAVIRUS 2 (TAT 6-24 HRS): SARS Coronavirus 2: NEGATIVE

## 2018-10-02 LAB — TYPE AND SCREEN
ABO/RH(D): O POS
Antibody Screen: NEGATIVE

## 2018-10-02 LAB — APTT: aPTT: 26 seconds (ref 24–36)

## 2018-10-04 ENCOUNTER — Encounter (HOSPITAL_COMMUNITY)
Admission: RE | Disposition: A | Discharge status: Home / Self Care | Payer: Self-pay | Source: Home / Self Care | Attending: Orthopedic Surgery

## 2018-10-04 ENCOUNTER — Inpatient Hospital Stay (HOSPITAL_COMMUNITY)
Admission: RE | Admit: 2018-10-04 | Discharge: 2018-10-05 | DRG: 454 | Disposition: A | Discharge status: Home / Self Care | Payer: No Typology Code available for payment source | Attending: Orthopedic Surgery | Admitting: Orthopedic Surgery

## 2018-10-04 ENCOUNTER — Inpatient Hospital Stay (HOSPITAL_COMMUNITY): Payer: No Typology Code available for payment source | Admitting: Certified Registered"

## 2018-10-04 ENCOUNTER — Inpatient Hospital Stay: Payer: Self-pay

## 2018-10-04 ENCOUNTER — Inpatient Hospital Stay (HOSPITAL_COMMUNITY): Payer: No Typology Code available for payment source

## 2018-10-04 ENCOUNTER — Encounter (HOSPITAL_COMMUNITY): Payer: Self-pay

## 2018-10-04 ENCOUNTER — Other Ambulatory Visit: Payer: Self-pay

## 2018-10-04 DIAGNOSIS — F419 Anxiety disorder, unspecified: Secondary | ICD-10-CM | POA: Diagnosis present

## 2018-10-04 DIAGNOSIS — M5116 Intervertebral disc disorders with radiculopathy, lumbar region: Secondary | ICD-10-CM | POA: Diagnosis present

## 2018-10-04 DIAGNOSIS — M48061 Spinal stenosis, lumbar region without neurogenic claudication: Principal | ICD-10-CM | POA: Diagnosis present

## 2018-10-04 DIAGNOSIS — Z981 Arthrodesis status: Secondary | ICD-10-CM | POA: Diagnosis not present

## 2018-10-04 DIAGNOSIS — M5106 Intervertebral disc disorders with myelopathy, lumbar region: Secondary | ICD-10-CM | POA: Diagnosis present

## 2018-10-04 DIAGNOSIS — I10 Essential (primary) hypertension: Secondary | ICD-10-CM | POA: Diagnosis present

## 2018-10-04 DIAGNOSIS — Z7951 Long term (current) use of inhaled steroids: Secondary | ICD-10-CM

## 2018-10-04 DIAGNOSIS — Z79899 Other long term (current) drug therapy: Secondary | ICD-10-CM | POA: Diagnosis not present

## 2018-10-04 DIAGNOSIS — F329 Major depressive disorder, single episode, unspecified: Secondary | ICD-10-CM | POA: Diagnosis present

## 2018-10-04 DIAGNOSIS — Z87891 Personal history of nicotine dependence: Secondary | ICD-10-CM

## 2018-10-04 DIAGNOSIS — Z419 Encounter for procedure for purposes other than remedying health state, unspecified: Secondary | ICD-10-CM

## 2018-10-04 DIAGNOSIS — M5410 Radiculopathy, site unspecified: Secondary | ICD-10-CM | POA: Diagnosis present

## 2018-10-04 HISTORY — PX: TRANSFORAMINAL LUMBAR INTERBODY FUSION (TLIF) WITH PEDICLE SCREW FIXATION 1 LEVEL: SHX6141

## 2018-10-04 LAB — POCT PREGNANCY, URINE: Preg Test, Ur: NEGATIVE

## 2018-10-04 SURGERY — TRANSFORAMINAL LUMBAR INTERBODY FUSION (TLIF) WITH PEDICLE SCREW FIXATION 1 LEVEL
Anesthesia: General | Site: Spine Lumbar | Laterality: Right

## 2018-10-04 MED ORDER — SUGAMMADEX SODIUM 200 MG/2ML IV SOLN
INTRAVENOUS | Status: DC | PRN
  Administered 2018-10-04: 213.2 mg via INTRAVENOUS

## 2018-10-04 MED ORDER — METHYLENE BLUE 0.5 % INJ SOLN
INTRAVENOUS | Status: AC
  Filled 2018-10-04: qty 10

## 2018-10-04 MED ORDER — HYDROMORPHONE HCL 1 MG/ML IJ SOLN
INTRAMUSCULAR | Status: AC
  Filled 2018-10-04: qty 1

## 2018-10-04 MED ORDER — ROCURONIUM BROMIDE 10 MG/ML (PF) SYRINGE
PREFILLED_SYRINGE | INTRAVENOUS | Status: DC | PRN
  Administered 2018-10-04: 60 mg via INTRAVENOUS

## 2018-10-04 MED ORDER — FENTANYL CITRATE (PF) 250 MCG/5ML IJ SOLN
INTRAMUSCULAR | Status: DC | PRN
  Administered 2018-10-04: 50 ug via INTRAVENOUS
  Administered 2018-10-04 (×3): 100 ug via INTRAVENOUS

## 2018-10-04 MED ORDER — PROPOFOL 10 MG/ML IV BOLUS
INTRAVENOUS | Status: DC | PRN
  Administered 2018-10-04: 130 mg via INTRAVENOUS

## 2018-10-04 MED ORDER — MENTHOL 3 MG MT LOZG: 1.0000 | LOZENGE | OROMUCOSAL | Status: DC | PRN

## 2018-10-04 MED ORDER — MORPHINE SULFATE (PF) 2 MG/ML IV SOLN: 1.0000 mg | INTRAVENOUS | Status: DC | PRN

## 2018-10-04 MED ORDER — SODIUM CHLORIDE 0.9% FLUSH: 3.0000 mL | Freq: Two times a day (BID) | INTRAVENOUS | Status: DC

## 2018-10-04 MED ORDER — BISACODYL 5 MG PO TBEC: 5.0000 mg | DELAYED_RELEASE_TABLET | Freq: Every day | ORAL | Status: DC | PRN

## 2018-10-04 MED ORDER — ONDANSETRON HCL 4 MG/2ML IJ SOLN
INTRAMUSCULAR | Status: DC | PRN
  Administered 2018-10-04: 4 mg via INTRAVENOUS

## 2018-10-04 MED ORDER — ACETAMINOPHEN 650 MG RE SUPP: 650.0000 mg | RECTAL | Status: DC | PRN

## 2018-10-04 MED ORDER — PHENYLEPHRINE 40 MCG/ML (10ML) SYRINGE FOR IV PUSH (FOR BLOOD PRESSURE SUPPORT)
PREFILLED_SYRINGE | INTRAVENOUS | Status: AC
  Filled 2018-10-04: qty 10

## 2018-10-04 MED ORDER — POTASSIUM CHLORIDE IN NACL 20-0.9 MEQ/L-% IV SOLN: INTRAVENOUS | Status: DC

## 2018-10-04 MED ORDER — ONDANSETRON HCL 4 MG/2ML IJ SOLN: 4.0000 mg | Freq: Four times a day (QID) | INTRAMUSCULAR | Status: DC | PRN

## 2018-10-04 MED ORDER — PROMETHAZINE HCL 25 MG/ML IJ SOLN: 6.2500 mg | INTRAMUSCULAR | Status: DC | PRN

## 2018-10-04 MED ORDER — ZOLPIDEM TARTRATE 5 MG PO TABS: 5.0000 mg | ORAL_TABLET | Freq: Every evening | ORAL | Status: DC | PRN

## 2018-10-04 MED ORDER — DEXAMETHASONE SODIUM PHOSPHATE 10 MG/ML IJ SOLN
INTRAMUSCULAR | Status: AC
  Filled 2018-10-04: qty 1

## 2018-10-04 MED ORDER — ROCURONIUM BROMIDE 10 MG/ML (PF) SYRINGE
PREFILLED_SYRINGE | INTRAVENOUS | Status: AC
  Filled 2018-10-04: qty 10

## 2018-10-04 MED ORDER — FENTANYL CITRATE (PF) 250 MCG/5ML IJ SOLN
INTRAMUSCULAR | Status: AC
  Filled 2018-10-04: qty 5

## 2018-10-04 MED ORDER — EPHEDRINE SULFATE-NACL 50-0.9 MG/10ML-% IV SOSY
PREFILLED_SYRINGE | INTRAVENOUS | Status: DC | PRN
  Administered 2018-10-04 (×4): 10 mg via INTRAVENOUS

## 2018-10-04 MED ORDER — DEXAMETHASONE SODIUM PHOSPHATE 10 MG/ML IJ SOLN
INTRAMUSCULAR | Status: DC | PRN
  Administered 2018-10-04: 5 mg via INTRAVENOUS

## 2018-10-04 MED ORDER — HYDROCHLOROTHIAZIDE 12.5 MG PO CAPS
12.5000 mg | ORAL_CAPSULE | Freq: Every day | ORAL | Status: DC
  Administered 2018-10-05: 09:00:00 12.5 mg via ORAL

## 2018-10-04 MED ORDER — PHENOL 1.4 % MT LIQD: 1.0000 | OROMUCOSAL | Status: DC | PRN

## 2018-10-04 MED ORDER — OXYCODONE HCL 5 MG PO TABS
ORAL_TABLET | ORAL | Status: AC
  Filled 2018-10-04: qty 1

## 2018-10-04 MED ORDER — CEFAZOLIN SODIUM-DEXTROSE 2-4 GM/100ML-% IV SOLN
2.0000 g | INTRAVENOUS | Status: AC
  Administered 2018-10-04: 13:00:00 2 g via INTRAVENOUS

## 2018-10-04 MED ORDER — ALUM & MAG HYDROXIDE-SIMETH 200-200-20 MG/5ML PO SUSP: 30.0000 mL | Freq: Four times a day (QID) | ORAL | Status: DC | PRN

## 2018-10-04 MED ORDER — OXYCODONE-ACETAMINOPHEN 5-325 MG PO TABS
1.0000 | ORAL_TABLET | ORAL | Status: DC | PRN
  Administered 2018-10-04 – 2018-10-05 (×3): 2 via ORAL
  Filled 2018-10-04 (×3): qty 2

## 2018-10-04 MED ORDER — DIAZEPAM 5 MG PO TABS
5.0000 mg | ORAL_TABLET | Freq: Four times a day (QID) | ORAL | Status: DC | PRN
  Administered 2018-10-04: 5 mg via ORAL
  Filled 2018-10-04: qty 1

## 2018-10-04 MED ORDER — FLUTICASONE PROPIONATE 50 MCG/ACT NA SUSP: 1.0000 | Freq: Every day | NASAL | Status: DC | PRN

## 2018-10-04 MED ORDER — ONDANSETRON HCL 4 MG/2ML IJ SOLN
INTRAMUSCULAR | Status: AC
  Filled 2018-10-04: qty 2

## 2018-10-04 MED ORDER — ACETAMINOPHEN 325 MG PO TABS: 650.0000 mg | ORAL_TABLET | ORAL | Status: DC | PRN

## 2018-10-04 MED ORDER — SENNOSIDES-DOCUSATE SODIUM 8.6-50 MG PO TABS: 1.0000 | ORAL_TABLET | Freq: Every evening | ORAL | Status: DC | PRN

## 2018-10-04 MED ORDER — PHENYLEPHRINE 40 MCG/ML (10ML) SYRINGE FOR IV PUSH (FOR BLOOD PRESSURE SUPPORT)
PREFILLED_SYRINGE | INTRAVENOUS | Status: DC | PRN
  Administered 2018-10-04: 80 ug via INTRAVENOUS

## 2018-10-04 MED ORDER — DOCUSATE SODIUM 100 MG PO CAPS
100.0000 mg | ORAL_CAPSULE | Freq: Two times a day (BID) | ORAL | Status: DC
  Administered 2018-10-04 – 2018-10-05 (×2): 100 mg via ORAL
  Filled 2018-10-04: qty 1

## 2018-10-04 MED ORDER — PROPOFOL 500 MG/50ML IV EMUL
INTRAVENOUS | Status: DC | PRN
  Administered 2018-10-04: 50 ug/kg/min via INTRAVENOUS

## 2018-10-04 MED ORDER — METHYLENE BLUE 0.5 % INJ SOLN
INTRAVENOUS | Status: DC | PRN
  Administered 2018-10-04: 1 mL

## 2018-10-04 MED ORDER — LOSARTAN POTASSIUM 50 MG PO TABS
50.0000 mg | ORAL_TABLET | Freq: Every day | ORAL | Status: DC
  Filled 2018-10-04: qty 1

## 2018-10-04 MED ORDER — SODIUM CHLORIDE 0.9% FLUSH: 3.0000 mL | INTRAVENOUS | Status: DC | PRN

## 2018-10-04 MED ORDER — VASOPRESSIN 20 UNIT/ML IV SOLN
INTRAVENOUS | Status: DC | PRN
  Administered 2018-10-04: 1 [IU] via INTRAVENOUS
  Administered 2018-10-04: 2 [IU] via INTRAVENOUS
  Administered 2018-10-04: 1 [IU] via INTRAVENOUS

## 2018-10-04 MED ORDER — CEFAZOLIN SODIUM-DEXTROSE 2-4 GM/100ML-% IV SOLN
2.0000 g | Freq: Three times a day (TID) | INTRAVENOUS | Status: AC
  Administered 2018-10-04 – 2018-10-05 (×2): 2 g via INTRAVENOUS
  Filled 2018-10-04 (×2): qty 100

## 2018-10-04 MED ORDER — LIDOCAINE 2% (20 MG/ML) 5 ML SYRINGE
INTRAMUSCULAR | Status: DC | PRN
  Administered 2018-10-04: 100 mg via INTRAVENOUS

## 2018-10-04 MED ORDER — BUPIVACAINE LIPOSOME 1.3 % IJ SUSP
20.0000 mL | Freq: Once | INTRAMUSCULAR | Status: DC
  Filled 2018-10-04: qty 20

## 2018-10-04 MED ORDER — CEFAZOLIN SODIUM-DEXTROSE 2-4 GM/100ML-% IV SOLN
INTRAVENOUS | Status: AC
  Filled 2018-10-04: qty 100

## 2018-10-04 MED ORDER — SODIUM CHLORIDE 0.9 % IV SOLN: 250.0000 mL | INTRAVENOUS | Status: DC

## 2018-10-04 MED ORDER — BUPROPION HCL ER (XL) 150 MG PO TB24
150.0000 mg | ORAL_TABLET | Freq: Every day | ORAL | Status: DC
  Administered 2018-10-04 – 2018-10-05 (×2): 150 mg via ORAL
  Filled 2018-10-04 (×2): qty 1

## 2018-10-04 MED ORDER — OXYCODONE HCL 5 MG PO TABS
5.0000 mg | ORAL_TABLET | Freq: Once | ORAL | Status: AC | PRN
  Administered 2018-10-04: 5 mg via ORAL

## 2018-10-04 MED ORDER — CITALOPRAM HYDROBROMIDE 20 MG PO TABS
40.0000 mg | ORAL_TABLET | Freq: Every day | ORAL | Status: DC
  Administered 2018-10-05: 40 mg via ORAL

## 2018-10-04 MED ORDER — LIDOCAINE 2% (20 MG/ML) 5 ML SYRINGE
INTRAMUSCULAR | Status: AC
  Filled 2018-10-04: qty 5

## 2018-10-04 MED ORDER — MAGNESIUM OXIDE 400 (241.3 MG) MG PO TABS
200.0000 mg | ORAL_TABLET | Freq: Every day | ORAL | Status: DC
  Administered 2018-10-05: 200 mg via ORAL
  Filled 2018-10-04: qty 1

## 2018-10-04 MED ORDER — HYDROMORPHONE HCL 1 MG/ML IJ SOLN
0.2500 mg | INTRAMUSCULAR | Status: DC | PRN
  Administered 2018-10-04: 0.5 mg via INTRAVENOUS

## 2018-10-04 MED ORDER — LACTATED RINGERS IV SOLN
INTRAVENOUS | Status: DC
  Administered 2018-10-04 (×2): via INTRAVENOUS

## 2018-10-04 MED ORDER — PHENYLEPHRINE HCL (PRESSORS) 10 MG/ML IV SOLN
INTRAVENOUS | Status: AC
  Filled 2018-10-04: qty 1

## 2018-10-04 MED ORDER — ONDANSETRON HCL 4 MG PO TABS: 4.0000 mg | ORAL_TABLET | Freq: Four times a day (QID) | ORAL | Status: DC | PRN

## 2018-10-04 MED ORDER — OXYCODONE HCL 5 MG/5ML PO SOLN: 5.0000 mg | Freq: Once | ORAL | Status: AC | PRN

## 2018-10-04 MED ORDER — BUPIVACAINE-EPINEPHRINE (PF) 0.25% -1:200000 IJ SOLN
INTRAMUSCULAR | Status: AC
  Filled 2018-10-04: qty 30

## 2018-10-04 MED ORDER — ALBUTEROL SULFATE (2.5 MG/3ML) 0.083% IN NEBU: 2.5000 mg | INHALATION_SOLUTION | Freq: Four times a day (QID) | RESPIRATORY_TRACT | Status: DC | PRN

## 2018-10-04 MED ORDER — FLEET ENEMA 7-19 GM/118ML RE ENEM: 1.0000 | ENEMA | Freq: Once | RECTAL | Status: DC | PRN

## 2018-10-04 MED ORDER — BUPIVACAINE-EPINEPHRINE 0.25% -1:200000 IJ SOLN
INTRAMUSCULAR | Status: DC | PRN
  Administered 2018-10-04: 6 mL
  Administered 2018-10-04: 20 mL

## 2018-10-04 MED ORDER — THROMBIN 20000 UNITS EX SOLR
CUTANEOUS | Status: AC
  Filled 2018-10-04: qty 20000

## 2018-10-04 MED ORDER — POVIDONE-IODINE 7.5 % EX SOLN: Freq: Once | CUTANEOUS | Status: DC

## 2018-10-04 MED ORDER — VITAMIN D 25 MCG (1000 UNIT) PO TABS
1000.0000 [IU] | ORAL_TABLET | Freq: Every day | ORAL | Status: DC
  Administered 2018-10-05: 1000 [IU] via ORAL
  Filled 2018-10-04: qty 1

## 2018-10-04 MED ORDER — 0.9 % SODIUM CHLORIDE (POUR BTL) OPTIME
TOPICAL | Status: DC | PRN
  Administered 2018-10-04 (×2): 1000 mL

## 2018-10-04 MED ORDER — SODIUM CHLORIDE 0.9 % IV SOLN
INTRAVENOUS | Status: DC | PRN
  Administered 2018-10-04: 13:00:00 25 ug/min via INTRAVENOUS

## 2018-10-04 MED ORDER — MIDAZOLAM HCL 2 MG/2ML IJ SOLN
INTRAMUSCULAR | Status: DC | PRN
  Administered 2018-10-04: 2 mg via INTRAVENOUS

## 2018-10-04 MED ORDER — VASOPRESSIN 20 UNIT/ML IV SOLN
INTRAVENOUS | Status: AC
  Filled 2018-10-04: qty 1

## 2018-10-04 MED ORDER — ACETAMINOPHEN 500 MG PO TABS: 1000.0000 mg | ORAL_TABLET | Freq: Once | ORAL | Status: DC

## 2018-10-04 MED ORDER — MIDAZOLAM HCL 2 MG/2ML IJ SOLN
INTRAMUSCULAR | Status: AC
  Filled 2018-10-04: qty 2

## 2018-10-04 MED ORDER — BUPIVACAINE LIPOSOME 1.3 % IJ SUSP
INTRAMUSCULAR | Status: DC | PRN
  Administered 2018-10-04: 20 mL

## 2018-10-04 MED ORDER — THROMBIN 20000 UNITS EX SOLR
CUTANEOUS | Status: DC | PRN
  Administered 2018-10-04: 20 mL via TOPICAL

## 2018-10-04 MED ORDER — LOSARTAN POTASSIUM-HCTZ 50-12.5 MG PO TABS: 1.0000 | ORAL_TABLET | Freq: Every day | ORAL | Status: DC

## 2018-10-04 SURGICAL SUPPLY — 74 items
BENZOIN TINCTURE PRP APPL 2/3 (GAUZE/BANDAGES/DRESSINGS) ×3 IMPLANT
BLADE CLIPPER SURG (BLADE) IMPLANT
BONE VIVIGEN FORMABLE 10CC (Bone Implant) ×3 IMPLANT
BUR PRESCISION 1.7 ELITE (BURR) ×3 IMPLANT
BUR ROUND FLUTED 5 RND (BURR) ×2 IMPLANT
BUR ROUND FLUTED 5MM RND (BURR) ×1
CAGE CONCORDE BULLET 9X7X27 (Cage) ×3 IMPLANT
CARTRIDGE OIL MAESTRO DRILL (MISCELLANEOUS) ×1 IMPLANT
CLOSURE WOUND 1/2 X4 (GAUZE/BANDAGES/DRESSINGS) ×1
CONT SPEC 4OZ CLIKSEAL STRL BL (MISCELLANEOUS) ×3 IMPLANT
COVER BACK TABLE 60X90IN (DRAPES) ×3 IMPLANT
COVER MAYO STAND STRL (DRAPES) ×6 IMPLANT
DIFFUSER DRILL AIR PNEUMATIC (MISCELLANEOUS) ×3 IMPLANT
DRAPE C-ARM 42X72 X-RAY (DRAPES) ×3 IMPLANT
DRAPE POUCH INSTRU U-SHP 10X18 (DRAPES) ×3 IMPLANT
DRAPE SURG 17X23 STRL (DRAPES) ×12 IMPLANT
DURAPREP 26ML APPLICATOR (WOUND CARE) ×3 IMPLANT
ELECT BLADE 4.0 EZ CLEAN MEGAD (MISCELLANEOUS) ×3
ELECT REM PT RETURN 9FT ADLT (ELECTROSURGICAL) ×3
ELECTRODE BLDE 4.0 EZ CLN MEGD (MISCELLANEOUS) ×1 IMPLANT
ELECTRODE REM PT RTRN 9FT ADLT (ELECTROSURGICAL) ×1 IMPLANT
EVACUATOR SILICONE 100CC (DRAIN) IMPLANT
FEE INTRAOP MONITOR IMPULS NCS (MISCELLANEOUS) ×1 IMPLANT
FILTER STRAW FLUID ASPIR (MISCELLANEOUS) ×3 IMPLANT
GAUZE 4X4 16PLY RFD (DISPOSABLE) ×3 IMPLANT
GAUZE SPONGE 4X4 12PLY STRL (GAUZE/BANDAGES/DRESSINGS) ×3 IMPLANT
GLOVE BIO SURGEON STRL SZ7 (GLOVE) ×3 IMPLANT
GLOVE BIO SURGEON STRL SZ8 (GLOVE) ×3 IMPLANT
GLOVE BIOGEL PI IND STRL 7.0 (GLOVE) ×1 IMPLANT
GLOVE BIOGEL PI IND STRL 8 (GLOVE) ×1 IMPLANT
GLOVE BIOGEL PI INDICATOR 7.0 (GLOVE) ×2
GLOVE BIOGEL PI INDICATOR 8 (GLOVE) ×2
GOWN STRL REUS W/ TWL LRG LVL3 (GOWN DISPOSABLE) ×2 IMPLANT
GOWN STRL REUS W/ TWL XL LVL3 (GOWN DISPOSABLE) ×1 IMPLANT
GOWN STRL REUS W/TWL LRG LVL3 (GOWN DISPOSABLE) ×4
GOWN STRL REUS W/TWL XL LVL3 (GOWN DISPOSABLE) ×2
INTRAOP MONITOR FEE IMPULS NCS (MISCELLANEOUS) ×1
INTRAOP MONITOR FEE IMPULSE (MISCELLANEOUS) ×2
IV CATH 14GX2 1/4 (CATHETERS) ×3 IMPLANT
KIT BASIN OR (CUSTOM PROCEDURE TRAY) ×3 IMPLANT
KIT POSITION SURG JACKSON T1 (MISCELLANEOUS) ×3 IMPLANT
KIT TURNOVER KIT B (KITS) ×3 IMPLANT
MARKER SKIN DUAL TIP RULER LAB (MISCELLANEOUS) ×6 IMPLANT
NEEDLE 18GX1X1/2 (RX/OR ONLY) (NEEDLE) ×3 IMPLANT
NEEDLE 22X1 1/2 (OR ONLY) (NEEDLE) ×6 IMPLANT
NEEDLE HYPO 25GX1X1/2 BEV (NEEDLE) ×3 IMPLANT
NEEDLE SPNL 18GX3.5 QUINCKE PK (NEEDLE) ×6 IMPLANT
NS IRRIG 1000ML POUR BTL (IV SOLUTION) ×6 IMPLANT
OIL CARTRIDGE MAESTRO DRILL (MISCELLANEOUS) ×3
PACK LAMINECTOMY NEURO (CUSTOM PROCEDURE TRAY) ×3 IMPLANT
PACK UNIVERSAL I (CUSTOM PROCEDURE TRAY) ×3 IMPLANT
PATTIES SURGICAL .5 X1 (DISPOSABLE) ×3 IMPLANT
PATTIES SURGICAL .5X1.5 (GAUZE/BANDAGES/DRESSINGS) ×3 IMPLANT
PROBE PEDCLE PROBE MAGSTM DISP (MISCELLANEOUS) ×3 IMPLANT
ROD PRE BENT EXPEDIUM 35MM (Rod) ×6 IMPLANT
SCREW SET SINGLE INNER (Screw) ×12 IMPLANT
SCREW VIPER CORT FIX 6.00X30 (Screw) ×12 IMPLANT
SPONGE INTESTINAL PEANUT (DISPOSABLE) ×3 IMPLANT
SPONGE SURGIFOAM ABS GEL 100 (HEMOSTASIS) ×3 IMPLANT
STRIP CLOSURE SKIN 1/2X4 (GAUZE/BANDAGES/DRESSINGS) ×2 IMPLANT
SUT MNCRL AB 4-0 PS2 18 (SUTURE) ×3 IMPLANT
SUT VIC AB 0 CT1 18XCR BRD 8 (SUTURE) ×1 IMPLANT
SUT VIC AB 0 CT1 8-18 (SUTURE) ×2
SUT VIC AB 1 CT1 18XCR BRD 8 (SUTURE) ×1 IMPLANT
SUT VIC AB 1 CT1 8-18 (SUTURE) ×2
SUT VIC AB 2-0 CT2 18 VCP726D (SUTURE) ×3 IMPLANT
SYR 20ML LL LF (SYRINGE) ×6 IMPLANT
SYR BULB IRRIGATION 50ML (SYRINGE) ×3 IMPLANT
SYR CONTROL 10ML LL (SYRINGE) ×6 IMPLANT
SYR TB 1ML LUER SLIP (SYRINGE) ×3 IMPLANT
TAPE CLOTH SURG 4X10 WHT LF (GAUZE/BANDAGES/DRESSINGS) ×3 IMPLANT
TRAY FOLEY MTR SLVR 16FR STAT (SET/KITS/TRAYS/PACK) ×3 IMPLANT
WATER STERILE IRR 1000ML POUR (IV SOLUTION) ×3 IMPLANT
YANKAUER SUCT BULB TIP NO VENT (SUCTIONS) ×3 IMPLANT

## 2018-10-04 NOTE — Transfer of Care (Signed)
Immediate Anesthesia Transfer of Care Note  Patient: Sherry Daniel  Procedure(s) Performed: RIGHT-SIDED LUMBAR FOUR-FIVE TRANSFORAMINAL LUMBAR INTERBODY FUSION WITH INSTRUMENTATION AND ALLOGRAFT (Right Spine Lumbar)  Patient Location: PACU  Anesthesia Type:General  Level of Consciousness: drowsy and patient cooperative  Airway & Oxygen Therapy: Patient Spontanous Breathing and Patient connected to face mask oxygen  Post-op Assessment: Report given to RN and Post -op Vital signs reviewed and stable  Post vital signs: Reviewed and stable  Last Vitals:  Vitals Value Taken Time  BP 128/74 10/04/18 1555  Temp 37 C 10/04/18 1555  Pulse 105 10/04/18 1556  Resp 14 10/04/18 1556  SpO2 100 % 10/04/18 1556  Vitals shown include unvalidated device data.  Last Pain:  Vitals:   10/04/18 1555  TempSrc:   PainSc: (P) Asleep         Complications: No apparent anesthesia complications

## 2018-10-04 NOTE — Op Note (Signed)
PATIENT NAME: Sherry Daniel   MEDICAL RECORD NO.:   [id number]   DATE OF BIRTH: 1970-08-24   DATE OF PROCEDURE: 10/04/2018                               OPERATIVE REPORT     PREOPERATIVE DIAGNOSES: 1. Bilateral lumbar radiculopathy. 2. L4-5 spinal stenosis. 3. Severe L4-5 degenerative disc disease   POSTOPERATIVE DIAGNOSES: 1. Bilateral lumbar radiculopathy. 2. L4-5 spinal stenosis. 3. Severe L4-5 degenerative disc disease   PROCEDURES: 1. L4/5 revision decompression 2. Right-sided L4-5 transforaminal lumbar interbody fusion. 3. Left-sided L4-5 posterolateral fusion. 4. Insertion of interbody device x1 (8 mm Concorde intervertebral spacer). 5. Placement of segmental posterior instrumentation L4, L5 bilaterally  6. Use of local autograft. 7. Use of morselized allograft - Vivigen 8. Intraoperative use of fluoroscopy.   SURGEON:  Phylliss Bob, MD.   ASSISTANTPricilla Holm, PA-C.   ANESTHESIA:  General endotracheal anesthesia.   COMPLICATIONS:  None.   DISPOSITION:  Stable.   ESTIMATED BLOOD LOSS:  100cc   INDICATIONS FOR SURGERY:  Briefly,  Sherry Daniel is a pleasant 48 year old female  who did present to me with severe and ongoing pain in the right and left legs and low back. I did feel that the symptoms were secondary to the findings noted above.   The patient failed extensive conservative care and did wish to proceed with the procedure  noted above.   OPERATIVE DETAILS:  On 10/04/2018, the patient was brought to surgery and general endotracheal anesthesia was administered.  The patient was placed prone on a well-padded flat Jackson bed with a spinal frame.  Antibiotics were given and a time-out procedure was performed. The back was prepped and draped in the usual fashion.  A midline incision was made overlying the L4-5 intervertebral space.  The fascia was incised at the midline.  The paraspinal musculature was bluntly swept laterally.  Anatomic landmarks for  the pedicles were exposed. Using fluoroscopy, I did cannulate the L5 pedicles bilaterally, using a medial to lateral cortical trajectory technique.  Then, on the left side, I did cannulate the L4 and L5 pedicles, also using a medial to lateral technique.  At this point, 6 x 30 mm screws were placed into the left pedicles, and a 35 mm rod was placed into the tulip heads of the screw, and caps were also placed. Distraction was then applied across the L4-5 intervertebral space, and the caps were then provisionally tightened.  On the right side, bone wax was placed into the cannulated pedicle holes.  I then proceeded with the decompressive aspect of the procedure at the L4-5 level.  A partial facetectomy was performed bilaterally at L4-5, decompressing the L4-5 intervertebral space.  I was very pleased with the decompression. With the assistant holding medial retraction of the traversing right L5 nerve, I did perform an annulotomy at the posterolateral aspect of the L4-5 intervertebral space.  I then used a series of curettes and pituitary rongeurs to perform a thorough and complete intervertebral diskectomy.  The intervertebral space was then liberally packed with autograft as well as allograft in the form of Vivigen, as was the appropriate-sized intervertebral spacer (7 mm).  The spacer was then tamped into position in the usual fashion.  I was very pleased with the press-fit of the spacer.  I then placed 6 mm screws on the right at L4 and L5. A 35-mm rod was then  placed and caps were placed. The distraction was then released on the contralateral side.  All caps were then locked.  The wound was copiously irrigated with a total of approximately 3 L prior to placing the bone graft.  Additional autograft and allograft was then packed into the posterolateral gutter on the left side to help aid in the success of the fusion.  The wound was  explored for any undue bleeding and there was no substantial bleeding  encountered.  Gel-Foam was placed over the laminectomy site.  The wound was then closed in layers using #1 Vicryl followed by 2-0 Vicryl, followed by 4-0 Monocryl.  Benzoin and Steri-Strips were applied followed by sterile dressing.     Of note, did use triggered EMG to test the screws on the right, and there is no screw the tested below 20 mA. There was no sustained abnormal EMG activity noted throughout the surgery.   Of note, Jason CoopKayla McKenzie was my assistant throughout surgery, and did aid in retraction, suctioning, and closure.    Estill BambergMark Keaden Gunnoe, MD

## 2018-10-04 NOTE — Anesthesia Postprocedure Evaluation (Signed)
Anesthesia Post Note  Patient: Sherry Daniel  Procedure(s) Performed: RIGHT-SIDED LUMBAR FOUR-FIVE TRANSFORAMINAL LUMBAR INTERBODY FUSION WITH INSTRUMENTATION AND ALLOGRAFT (Right Spine Lumbar)     Anesthesia Post Evaluation  Last Vitals:  Vitals:   10/04/18 1007 10/04/18 1555  BP: (!) 150/89 128/74  Pulse: 91 (!) 105  Resp: 20 13  Temp: 37.1 C 37 C  SpO2: 100% 100%    Last Pain:  Vitals:   10/04/18 1555  TempSrc:   PainSc: [place]

## 2018-10-04 NOTE — Anesthesia Procedure Notes (Signed)
Procedure Name: Intubation Date/Time: 10/04/2018 1:13 PM Performed by: Kathryne Hitch, CRNA Pre-anesthesia Checklist: Patient identified, Emergency Drugs available, Suction available and Patient being monitored Patient Re-evaluated:Patient Re-evaluated prior to induction Oxygen Delivery Method: Circle system utilized Preoxygenation: Pre-oxygenation with 100% oxygen Induction Type: IV induction Ventilation: Mask ventilation without difficulty and Oral airway inserted - appropriate to patient size Laryngoscope Size: Glidescope and 4 Grade View: Grade I Tube type: Oral Tube size: 7.0 mm Number of attempts: 1 Airway Equipment and Method: Stylet and Oral airway Placement Confirmation: ETT inserted through vocal cords under direct vision,  positive ETCO2 and breath sounds checked- equal and bilateral Secured at: 22 cm Tube secured with: Tape Dental Injury: Teeth and Oropharynx as per pre-operative assessment

## 2018-10-04 NOTE — Anesthesia Preprocedure Evaluation (Signed)
Anesthesia Evaluation  Patient identified by MRN, date of birth, ID band Patient awake    Reviewed: Allergy & Precautions, NPO status , Patient's Chart, lab work & pertinent test results  History of Anesthesia Complications Negative for: history of anesthetic complications  Airway Mallampati: IV  TM Distance: >3 FB Neck ROM: Full  Mouth opening: Limited Mouth Opening  Dental no notable dental hx. (+) Dental Advisory Given   Pulmonary Patient abstained from smoking., former smoker,    breath sounds clear to auscultation       Cardiovascular hypertension, Pt. on medications (-) angina Rhythm:Regular Rate:Normal     Neuro/Psych PSYCHIATRIC DISORDERS Anxiety Depression    GI/Hepatic Neg liver ROS, GERD  Controlled,  Endo/Other  Morbid obesityBMI 37  Renal/GU negative Renal ROS  negative genitourinary   Musculoskeletal  (+) Arthritis , Osteoarthritis,  DDD   Abdominal (+) + obese,   Peds  Hematology negative hematology ROS (+)   Anesthesia Other Findings   Reproductive/Obstetrics negative OB ROS                             Anesthesia Physical  Anesthesia Plan  ASA: II  Anesthesia Plan: General   Post-op Pain Management:    Induction: Intravenous  PONV Risk Score and Plan: 3 and Dexamethasone and Ondansetron  Airway Management Planned: Oral ETT and Video Laryngoscope Planned  Additional Equipment:   Intra-op Plan:   Post-operative Plan: Extubation in OR  Informed Consent: I have reviewed the patients History and Physical, chart, labs and discussed the procedure including the risks, benefits and alternatives for the proposed anesthesia with the patient or authorized representative who has indicated his/her understanding and acceptance.     Dental advisory given  Plan Discussed with: Surgeon and CRNA  Anesthesia Plan Comments: (Plan routine monitors GETA with VideoGlide  intubation)        Anesthesia Quick Evaluation

## 2018-10-04 NOTE — H&P (Signed)
PREOPERATIVE H&P  Chief Complaint: Low back pain, bilateral leg pain  HPI: Sherry Daniel is a 48 y.o. female who presents with ongoing pain in the low back and bilateral legs.   MRI reveals severe DDD and stenosis at L4-5. Patient is s/p a previous decompression at L4-5.  Patient has failed multiple forms of conservative care and continues to have pain (see office notes for additional details regarding the patient's full course of treatment)  Past Medical History:  Diagnosis Date  . Anxiety   . DDD (degenerative disc disease), cervical   . Depression   . Headache   . Hypertension   . Palpitations    Past Surgical History:  Procedure Laterality Date  . BACK SURGERY    . CERVICAL DISCECTOMY    . LUMBAR FUSION    . POSTERIOR CERVICAL FUSION/FORAMINOTOMY N/A 11/01/2017   Procedure: POSTERIOR CERVICAL DECOMPRESSION FUSION, CERVICAL 3-4, CERVICAL 4-5, CERVICAL 5-6, CERVICAL 6-7 WITH INSTRUMENTATION AND ALLOGRAFT;  Surgeon: Estill Bambergumonski, Evangelina Delancey, MD;  Location: [facility] OR;  Service: Orthopedics;  Laterality: N/A;  . UTERINE FIBROID SURGERY     Social History   Socioeconomic History  . Marital status: Single    Spouse name: Not on file  . Number of children: Not on file  . Years of education: Not on file  . Highest education level: Not on file  Occupational History  . Not on file  Social Needs  . Financial resource strain: Not on file  . Food insecurity    Worry: Not on file    Inability: Not on file  . Transportation needs    Medical: Not on file    Non-medical: Not on file  Tobacco Use  . Smoking status: Former Smoker    Quit date: 09/24/2017    Years since quitting: 1.0  . Smokeless tobacco: Never Used  . Tobacco comment: reports she smoked mainly on the weekend socially  Substance and Sexual Activity  . Alcohol use: Yes    Comment: 1 to 2 days a month socially   . Drug use: Never  . Sexual activity: Not on file  Lifestyle  . Physical activity    Days per week: Not on  file    Minutes per session: Not on file  . Stress: Not on file  Relationships  . Social Musicianconnections    Talks on phone: Not on file    Gets together: Not on file    Attends religious service: Not on file    Active member of club or organization: Not on file    Attends meetings of clubs or organizations: Not on file    Relationship status: Not on file  Other Topics Concern  . Not on file  Social History Narrative  . Not on file   No family history on file. No Known Allergies Prior to Admission medications   Medication Sig Start Date End Date Taking? Authorizing Provider  acetaminophen (TYLENOL) 500 MG tablet Take 1,000 mg by mouth every 6 (six) hours as needed for moderate pain.   Yes [provider]  albuterol (VENTOLIN HFA) 108 (90 Base) MCG/ACT inhaler Inhale 1-2 puffs into the lungs every 6 (six) hours as needed for wheezing or shortness of breath.   Yes [provider]  buPROPion (WELLBUTRIN XL) 150 MG 24 hr tablet Take 150 mg by mouth daily. 08/17/18  Yes [provider]  cholecalciferol (VITAMIN D) 1000 units tablet Take 1,000 Units by mouth daily.   Yes [provider]  citalopram (CELEXA) 40 MG tablet Take 40 mg by mouth daily.  09/21/17  Yes [provider]  cyclobenzaprine (FLEXERIL) 5 MG tablet Take 5-10 mg by mouth 3 (three) times daily as needed for muscle spasms.   Yes [provider]  Diphenhydramine-Pseudoephed (BENADRYL DECONGESTANT PO) Take 2 tablets by mouth daily as needed (congestion).   Yes [provider]  fluticasone (FLONASE) 50 MCG/ACT nasal spray Place 1 spray into both nostrils daily as needed for allergies or rhinitis.   Yes [provider]  losartan-hydrochlorothiazide (HYZAAR) 50-12.5 MG tablet Take 1 tablet by mouth daily. 09/11/17  Yes [provider]  Magnesium 250 MG TABS Take 250 mg by mouth daily.   Yes [provider]  Multiple Vitamin (MULTIVITAMIN WITH MINERALS)  TABS tablet Take 1 tablet by mouth daily.   Yes [provider]  Specialty Vitamins Products (VITAMINS FOR HAIR PO) Take 2 tablets by mouth daily.   Yes [provider]  vitamin B-12 (CYANOCOBALAMIN) 500 MCG tablet Take 500 mcg by mouth daily.    Yes [provider]  vitamin E 200 UNIT capsule Take 200 Units by mouth daily.    Yes [provider]     All other systems have been reviewed and were otherwise negative with the exception of those mentioned in the HPI and as above.  Physical Exam: There were no vitals filed for this visit.  There is no height or weight on file to calculate BMI.  General: Alert, no acute distress Cardiovascular: No pedal edema Respiratory: No cyanosis, no use of accessory musculature Skin: No lesions in the area of chief complaint Neurologic: Sensation intact distally Psychiatric: Patient is competent for consent with normal mood and affect Lymphatic: No axillary or cervical lymphadenopathy   Assessment/Plan: SEVERE LUMBAR 4 - LUMBAR 5 DEGENERATIVE DISC DISEASE AND STENOSIS Plan for Procedure(s): RIGHT-SIDED LUMBAR 4-5 TRANSFORAMINAL LUMBAR INTERBODY FUSION WITH INSTRUMENTATION AND ALLOGRAFT  *Of note, the patient did awake this morning with questions and concerns about surgery.  We did talk through these concerns in extensive detail at the bedside today.  We did again discuss the risks of surgery and the expected recovery period associated with surgery.  We did discuss the alternative, including not proceeding with surgery.  Ultimately, she did elect to proceed with surgery today, as we previously discussed in the office.  She did have coffee at about 6:00 this morning.  As such, per the anesthesia team, I does appear we will need to wait until noon to proceed with surgery.   Norva Karvonen, MD 10/04/2018 7:44 AM

## 2018-10-05 ENCOUNTER — Encounter (HOSPITAL_COMMUNITY): Payer: Self-pay | Admitting: Orthopedic Surgery

## 2018-10-05 MED ORDER — DIAZEPAM 5 MG PO TABS: 5.0000 mg | ORAL_TABLET | Freq: Four times a day (QID) | ORAL | 0 refills | Status: DC | PRN

## 2018-10-05 MED ORDER — OXYCODONE-ACETAMINOPHEN 5-325 MG PO TABS: 1.0000 | ORAL_TABLET | ORAL | 0 refills | Status: DC | PRN

## 2018-10-05 NOTE — Progress Notes (Signed)
    Patient doing well  + expected LBP Patient denies leg pain   Physical Exam: Vitals:   10/05/18 0333 10/05/18 0746  BP: 131/83 130/81  Pulse: 81 82  Resp: 20 16  Temp: 98.9 F (37.2 C) 97.7 F (36.5 C)  SpO2: 100% 100%    Dressing in place NVI  POD #1 s/p L4/5 decompression and fusion, doing well  - up with PT/OT, encourage ambulation - Percocet for pain, Valium for muscle spasms - likely d/c home today with f/u in 2 weeks

## 2018-10-05 NOTE — Evaluation (Signed)
Physical Therapy Evaluation Patient Details Name: Sherry Daniel MRN: [id number] DOB: [birth year] Today's Date: 10/05/2018   History of Present Illness  48 yo admitted for L4-5 XLIF. PMhx: cervical and back fusion, HTN, anxiety, DDD  Clinical Impression  Pt sitting on EOB, dressed with brace donned on arrival. Pt provided back handout and educated for all transfers, gait, mobility, brace wear and functional activity adhering to precautions. Pt with decreased activity tolerance and transfers who will benefit from acute therapy to maximize mobility and independence who is safe to return home. Encouraged daily ambulation with education for progression.      Follow Up Recommendations No PT follow up    Equipment Recommendations  None recommended by PT    Recommendations for Other Services       Precautions / Restrictions Precautions Precautions: Back Precaution Booklet Issued: Yes (comment) Required Braces or Orthoses: Spinal Brace Spinal Brace: Thoracolumbosacral orthotic;Applied in sitting position      Mobility  Bed Mobility Overal bed mobility: Needs Assistance Bed Mobility: Rolling;Sidelying to Sit;Sit to Sidelying Rolling: Supervision Sidelying to sit: Supervision     Sit to sidelying: Supervision General bed mobility comments: cues for sequence to prevent twisting  Transfers Overall transfer level: Needs assistance   Transfers: Sit to/from Stand Sit to Stand: Supervision         General transfer comment: cues for posture and placing hands on thighs  Ambulation/Gait Ambulation/Gait assistance: Modified independent (Device/Increase time) Gait Distance (Feet): 400 Feet Assistive device: None Gait Pattern/deviations: WFL(Within Functional Limits)   Gait velocity interpretation: >4.37 ft/sec, indicative of normal walking speed General Gait Details: pt with good stability and safety, functional speed with slightly antalgic gait without need for assist to  stabilize  Stairs Stairs: Yes Stairs assistance: Modified independent (Device/Increase time) Stair Management: Alternating pattern;Forwards Number of Stairs: 11    Wheelchair Mobility    Modified Rankin (Stroke Patients Only)       Balance Overall balance assessment: No apparent balance deficits (not formally assessed)                                           Pertinent Vitals/Pain Pain Assessment: 0-10 Pain Score: 6  Pain Location: incision Pain Descriptors / Indicators: Operative site guarding Pain Intervention(s): Limited activity within patient's tolerance;Premedicated before session;Repositioned;Monitored during session    Home Living Family/patient expects to be discharged to:: Private residence Living Arrangements: Parent;Other relatives Available Help at Discharge: Family;Available 24 hours/day Type of Home: House Home Access: Stairs to enter   [employer] of Steps: 3 Home Layout: Two level;Bed/bath upstairs Home Equipment: None      Prior Function Level of Independence: Independent         Comments: difficulty retrieving items when dropped     Hand Dominance        Extremity/Trunk Assessment   Upper Extremity Assessment Upper Extremity Assessment: Overall WFL for tasks assessed    Lower Extremity Assessment Lower Extremity Assessment: Overall WFL for tasks assessed    Cervical / Trunk Assessment Cervical / Trunk Assessment: Other exceptions Cervical / Trunk Exceptions: post surgical guarding  Communication   Communication: No difficulties  Cognition Arousal/Alertness: Awake/alert Behavior During Therapy: WFL for tasks assessed/performed Overall Cognitive Status: Within Functional Limits for tasks assessed  General Comments      Exercises     Assessment/Plan    PT Assessment Patient needs continued PT services  PT Problem List Decreased  mobility;Decreased activity tolerance;Decreased knowledge of precautions;Pain       PT Treatment Interventions Gait training;Therapeutic exercise;Patient/family education;Functional mobility training;Therapeutic activities    PT Goals (Current goals can be found in the Care Plan section)  Acute Rehab PT Goals Patient Stated Goal: return home PT Goal Formulation: With patient Time For Goal Achievement: 10/12/18 Potential to Achieve Goals: Good    Frequency Min 5X/week   Barriers to discharge        Co-evaluation               AM-PAC PT "6 Clicks" Mobility  Outcome Measure Help needed turning from your back to your side while in a flat bed without using bedrails?: A Little Help needed moving from lying on your back to sitting on the side of a flat bed without using bedrails?: A Little Help needed moving to and from a bed to a chair (including a wheelchair)?: None Help needed standing up from a chair using your arms (e.g., wheelchair or bedside chair)?: None Help needed to walk in hospital room?: None Help needed climbing 3-5 steps with a railing? : None 6 Click Score: 22    End of Session Equipment Utilized During Treatment: Back brace Activity Tolerance: Patient tolerated treatment well Patient left: in chair;with call bell/phone within reach Nurse Communication: Mobility status PT Visit Diagnosis: Other abnormalities of gait and mobility (R26.89)    Time: [id number] PT Time Calculation (min) (ACUTE ONLY): 19 min   Charges:   PT Evaluation $PT Eval Moderate Complexity: 1 Mod          Wellington Winegarden Abner Greenspanabor Uziel Covault, PT Acute Rehabilitation Services Pager: [phone] Office: [phone]   Ashwini Jago B Teresia Myint 10/05/2018, 8:44 AM

## 2018-10-05 NOTE — Evaluation (Signed)
Occupational Therapy Evaluation Patient Details Name: Sherry Daniel MRN: [id number] DOB: March 04, 1970 Today's Date: 10/05/2018    History of Present Illness 48 yo admitted for L4-5 XLIF. PMhx: cervical and back fusion, HTN, anxiety, DDD   Clinical Impression   PTA Pt independent in ADL and mobility. Today Pt is supervision for transfers, min guard for LB ADL - educated in AE for pain management but Pt able to complete without it. Back handout provided and reviewed adls in detail. Pt educated on: clothing between brace, never sleep in brace, set an alarm at night for medication, avoid sitting for long periods of time, correct bed positioning for sleeping, correct sequence for bed mobility, avoiding lifting more than 5 pounds and never wash directly over incision once MD clears for shower. All education is complete and patient indicates understanding. OT to sign of at this time.     Follow Up Recommendations  No OT follow up;Supervision - Intermittent    Equipment Recommendations  None recommended by OT    Recommendations for Other Services       Precautions / Restrictions Precautions Precautions: Back Precaution Booklet Issued: Yes (comment) Precaution Comments: reviewed handout in full Required Braces or Orthoses: Spinal Brace Spinal Brace: Thoracolumbosacral orthotic;Applied in sitting position Restrictions Weight Bearing Restrictions: No      Mobility Bed Mobility Overal bed mobility: Needs Assistance Bed Mobility: Rolling;Sidelying to Sit;Sit to Sidelying Rolling: Supervision Sidelying to sit: Supervision     Sit to sidelying: Supervision General bed mobility comments: sitting EOB at beginning and end of session, Pt verbalized understanding of lgog roll technique  Transfers Overall transfer level: Needs assistance Equipment used: None Transfers: Sit to/from Stand Sit to Stand: Supervision         General transfer comment: cues for posture and placing hands on  thighs    Balance Overall balance assessment: No apparent balance deficits (not formally assessed)                                         ADL either performed or assessed with clinical judgement   ADL Overall ADL's : Needs assistance/impaired Eating/Feeding: Independent   Grooming: Supervision/safety;Standing   Upper Body Bathing: Supervision/ safety;Standing Upper Body Bathing Details (indicate cue type and reason): educated on long handle sponge Lower Body Bathing: Min guard;Sitting/lateral leans   Upper Body Dressing : Modified independent;Sitting Upper Body Dressing Details (indicate cue type and reason): able to don shirt and brace without assist Lower Body Dressing: Min guard;Sit to/from stand Lower Body Dressing Details (indicate cue type and reason): able to perform figure 4 for socks, able to don underwear/pants Toilet Transfer: Supervision/safety;Ambulation   Toileting- Clothing Manipulation and Hygiene: Min guard;Sit to/from Nurse, children's Details (indicate cue type and reason): educated on having someone there at first after MD clears for shower  Functional mobility during ADLs: Supervision/safety General ADL Comments: Pt verbalized and demonstrated understanding of back precautions applied to ADL and IADL (discussed specifically dishwasher, fridge, closet)     Vision Patient Visual Report: No change from baseline       Perception     Praxis      Pertinent Vitals/Pain Pain Assessment: 0-10 Pain Score: 6  Pain Location: incision Pain Descriptors / Indicators: Operative site guarding Pain Intervention(s): Limited activity within patient's tolerance;Monitored during session;Repositioned;Other (comment)(brace)     Hand Dominance Right   Extremity/Trunk Assessment  Upper Extremity Assessment Upper Extremity Assessment: Overall WFL for tasks assessed   Lower Extremity Assessment Lower Extremity Assessment: Overall WFL for  tasks assessed   Cervical / Trunk Assessment Cervical / Trunk Assessment: Other exceptions Cervical / Trunk Exceptions: s/p sx   Communication Communication Communication: No difficulties   Cognition Arousal/Alertness: Awake/alert Behavior During Therapy: WFL for tasks assessed/performed Overall Cognitive Status: Within Functional Limits for tasks assessed                                     General Comments       Exercises     Shoulder Instructions      Home Living Family/patient expects to be discharged to:: Private residence Living Arrangements: Parent;Other relatives(sister) Available Help at Discharge: Family;Available 24 hours/day Type of Home: House Home Access: Stairs to enter [employer] CorporationEntrance Stairs-Number of Steps: 3   Home Layout: Two level;Bed/bath upstairs Alternate Level Stairs-Number of Steps: flight   Bathroom Shower/Tub: Chief Strategy OfficerTub/shower unit   Bathroom Toilet: Standard     Home Equipment: None          Prior Functioning/Environment Level of Independence: Independent        Comments: difficulty retrieving items when dropped        OT Problem List: Decreased activity tolerance;Decreased knowledge of precautions;Pain      OT Treatment/Interventions:      OT Goals(Current goals can be found in the care plan section) Acute Rehab OT Goals Patient Stated Goal: return home, find a job that is better for her back OT Goal Formulation: With patient Time For Goal Achievement: 10/19/18 Potential to Achieve Goals: Good  OT Frequency:     Barriers to D/C:            Co-evaluation              AM-PAC OT "6 Clicks" Daily Activity     Outcome Measure Help from another person eating meals?: None Help from another person taking care of personal grooming?: None Help from another person toileting, which includes using toliet, bedpan, or urinal?: A Little Help from another person bathing (including washing, rinsing, drying)?: A  Little Help from another person to put on and taking off regular upper body clothing?: A Little Help from another person to put on and taking off regular lower body clothing?: A Little 6 Click Score: 20   End of Session Equipment Utilized During Treatment: Back brace Nurse Communication: Mobility status  Activity Tolerance: Patient tolerated treatment well Patient left: in bed;with call bell/phone within reach;with nursing/sitter in room(sitting EOB)  OT Visit Diagnosis: Unsteadiness on feet (R26.81);Pain Pain - Right/Left: (central) Pain - part of body: (lumbar)                Time: [id number] OT Time Calculation (min): 15 min Charges:  OT General Charges $OT Visit: 1 Visit OT Evaluation $OT Eval Moderate Complexity: 1 Mod  Sherryl MangesLaura Lakya Schrupp OTR/L Acute Rehabilitation Services Pager: [phone] Office: [phone]  Evern BioLaura J Bernadett Milian 10/05/2018, 12:17 PM

## 2018-10-05 NOTE — Plan of Care (Signed)
Patient alert and oriented, mae's well, voiding adequate amount of urine, swallowing without difficulty, no c/o pain at time of discharge. Patient discharged home with family. Script and discharged instructions given to patient. Patient and family stated understanding of instructions given. Patient has an appointment with Dr. Dumonski 

## 2018-10-09 MED FILL — Sodium Chloride IV Soln 0.9%: INTRAVENOUS | Qty: 2000 | Status: AC

## 2018-10-09 MED FILL — Heparin Sodium (Porcine) Inj 1000 Unit/ML: INTRAMUSCULAR | Qty: 30 | Status: AC

## 2018-10-24 NOTE — Discharge Summary (Signed)
Patient ID: Sherry Daniel MRN: [id number] DOB/AGE: 50-Jan-1972 50 y.o.  Admit date: 10/04/2018 Discharge date: 10/05/2018  Admission Diagnoses:  Active Problems:   Radiculopathy   Discharge Diagnoses:  Same  Past Medical History:  Diagnosis Date  . Anxiety   . DDD (degenerative disc disease), cervical   . Depression   . Headache   . Hypertension   . Palpitations     Surgeries: Procedure(s): RIGHT-SIDED LUMBAR FOUR-FIVE TRANSFORAMINAL LUMBAR INTERBODY FUSION WITH INSTRUMENTATION AND ALLOGRAFT on 10/04/2018   Consultants: None  Discharged Condition: Improved  Hospital Course: Sherry Daniel is an 48 y.o. female who was admitted 10/04/2018 for operative treatment of radiculopathy. Patient has severe unremitting pain that affects sleep, daily activities, and work/hobbies. After pre-op clearance the patient was taken to the operating room on 10/04/2018 and underwent  Procedure(s): RIGHT-SIDED LUMBAR FOUR-FIVE TRANSFORAMINAL LUMBAR INTERBODY FUSION WITH INSTRUMENTATION AND ALLOGRAFT.    Patient was given perioperative antibiotics:  Anti-infectives (From admission, onward)   Start     Dose/Rate Route Frequency Ordered Stop   10/04/18 2030  ceFAZolin (ANCEF) IVPB 2g/100 mL premix     2 g 200 mL/hr over 30 Minutes Intravenous Every 8 hours 10/04/18 1742 10/05/18 0356   10/04/18 1200  ceFAZolin (ANCEF) IVPB 2g/100 mL premix     2 g 200 mL/hr over 30 Minutes Intravenous To Short Stay 10/04/18 1149 10/04/18 1300   10/04/18 1000  ceFAZolin (ANCEF) 2-4 GM/100ML-% IVPB    Note to Pharmacy: Pauletta Browns   : cabinet override      10/04/18 1000 10/04/18 1230       Patient was given sequential compression devices, early ambulation to prevent DVT.  Patient benefited maximally from hospital stay and there were no complications.    Recent vital signs: BP 130/81 (BP Location: Right Arm)   Pulse 82   Temp 97.7 F (36.5 C) (Oral)   Resp 16   Ht 5\' 6"  (1.676 m)   Wt 106.6 kg    LMP 09/25/2018   SpO2 100%   BMI 37.93 kg/m    Discharge Medications:   Allergies as of 10/05/2018   No Known Allergies     Medication List    TAKE these medications   albuterol 108 (90 Base) MCG/ACT inhaler Commonly known as: VENTOLIN HFA Inhale 1-2 puffs into the lungs every 6 (six) hours as needed for wheezing or shortness of breath.   BENADRYL DECONGESTANT PO Take 2 tablets by mouth daily as needed (congestion).   buPROPion 150 MG 24 hr tablet Commonly known as: WELLBUTRIN XL Take 150 mg by mouth daily.   cholecalciferol 1000 units tablet Commonly known as: VITAMIN D Take 1,000 Units by mouth daily.   citalopram 40 MG tablet Commonly known as: CELEXA Take 40 mg by mouth daily.   diazepam 5 MG tablet Commonly known as: VALIUM Take 1 tablet (5 mg total) by mouth every 6 (six) hours as needed for muscle spasms.   fluticasone 50 MCG/ACT nasal spray Commonly known as: FLONASE Place 1 spray into both nostrils daily as needed for allergies or rhinitis.   losartan-hydrochlorothiazide 50-12.5 MG tablet Commonly known as: HYZAAR Take 1 tablet by mouth daily.   Magnesium 250 MG Tabs Take 250 mg by mouth daily.   multivitamin with minerals Tabs tablet Take 1 tablet by mouth daily.   oxyCODONE-acetaminophen 5-325 MG tablet Commonly known as: PERCOCET/ROXICET Take 1-2 tablets by mouth every 4 (four) hours as needed for moderate pain or severe pain.  vitamin B-12 500 MCG tablet Commonly known as: CYANOCOBALAMIN Take 500 mcg by mouth daily.   vitamin E 200 UNIT capsule Take 200 Units by mouth daily.   VITAMINS FOR HAIR PO Take 2 tablets by mouth daily.       Diagnostic Studies: Dg Lumbar Spine 2-3 Views  Result Date: 10/04/2018 CLINICAL DATA:  L4-5 fusion. EXAM: LUMBAR SPINE - 2-3 VIEW; DG C-ARM 1-60 MIN COMPARISON:  Earlier today. FINDINGS: AP and lateral C-arm views of the lumbosacral spine demonstrate interval interbody and pedicle screw and rod fusion at  the L4-5 level, assuming the last open disc space is the L5-S1 level. Minimal retrolisthesis at that level without significant change. IMPRESSION: Hardware fusion at the L4-5 level. Electronically Signed   By: Claudie Revering M.D.   On: 10/04/2018 15:25   Dg Lumbar Spine 1 View  Result Date: 10/04/2018 CLINICAL DATA:  L4-5 TLIF, localization EXAM: LUMBAR SPINE - 1 VIEW COMPARISON:  None. FINDINGS: Surgical implements are projected in the posterior soft tissues, directed towards the the spinous process of L3 and posterior elements of S1. Degenerative changes are present in the lumbar spine most pronounced at L4-5 and milder at L5-S1. IMPRESSION: Surgical implements are directed towards the L3 spinous process and posterior elements of S1. Electronically Signed   By: Lovena Le M.D.   On: 10/04/2018 14:39   Dg C-arm 1-60 Min  Result Date: 10/04/2018 CLINICAL DATA:  L4-5 fusion. EXAM: LUMBAR SPINE - 2-3 VIEW; DG C-ARM 1-60 MIN COMPARISON:  Earlier today. FINDINGS: AP and lateral C-arm views of the lumbosacral spine demonstrate interval interbody and pedicle screw and rod fusion at the L4-5 level, assuming the last open disc space is the L5-S1 level. Minimal retrolisthesis at that level without significant change. IMPRESSION: Hardware fusion at the L4-5 level. Electronically Signed   By: Claudie Revering M.D.   On: 10/04/2018 15:25    Disposition:    POD #1 s/p L4/5 decompression and fusion, doing well  - up with PT/OT, encourage ambulation - Percocet for pain, Valium for muscle spasms - likely d/c home today with f/u in 2 weeks  Signed: Lennie Muckle Chantella Creech 10/24/2018, 1:02 PM

## 2020-02-26 ENCOUNTER — Other Ambulatory Visit: Payer: Self-pay | Admitting: Orthopedic Surgery

## 2020-02-26 DIAGNOSIS — M533 Sacrococcygeal disorders, not elsewhere classified: Secondary | ICD-10-CM

## 2020-03-12 ENCOUNTER — Ambulatory Visit
Admission: RE | Admit: 2020-03-12 | Discharge: 2020-03-12 | Disposition: A | Discharge status: Home / Self Care | Payer: No Typology Code available for payment source | Source: Ambulatory Visit | Attending: Orthopedic Surgery | Admitting: Orthopedic Surgery

## 2020-03-12 ENCOUNTER — Other Ambulatory Visit: Payer: Self-pay

## 2020-03-12 DIAGNOSIS — M533 Sacrococcygeal disorders, not elsewhere classified: Secondary | ICD-10-CM

## 2021-03-17 ENCOUNTER — Other Ambulatory Visit: Payer: Self-pay | Admitting: Orthopedic Surgery

## 2021-03-30 NOTE — Pre-Procedure Instructions (Signed)
Surgical Instructions ? ? ? Your procedure is scheduled on Thursday, March 16th. ? Report to [facility] Main Entrance "A" at 08:00 A.M., then check in with the Admitting office. ? Call this number if you have problems the morning of surgery: ? [phone] ? ? If you have any questions prior to your surgery date call [phone]: Open Monday-Friday 8am-4pm ? ? ? Remember: ? Do not eat after midnight the night before your surgery ? ?You may drink clear liquids until 08:00 AM the morning of your surgery.   ?Clear liquids allowed are: Water, Non-Citrus Juices (without pulp), Carbonated Beverages, Clear Tea, Black Coffee Only (NO MILK, CREAM OR POWDERED CREAMER of any kind), and Gatorade. ? ? ?Patient Instructions ? ?The night before surgery:  ?No food after midnight. ONLY clear liquids after midnight ? ?The day of surgery (if you do NOT have diabetes):  ?Drink ONE (1) Pre-Surgery Clear Ensure by 08:00 AM the morning of surgery. Drink in one sitting. Do not sip.  ?This drink was given to you during your hospital  ?pre-op appointment visit. ? ?Nothing else to drink after completing the  ?Pre-Surgery Clear Ensure. ? ? ?       If you have questions, please contact your surgeon?s office.  ? ?  ? Take these medicines the morning of surgery with A SIP OF WATER  ?buPROPion (WELLBUTRIN XL) ?citalopram (CELEXA) ? ? ? ?If needed: ?albuterol (VENTOLIN HFA)- bring with you on the day of surgery if needed ?diazepam (VALIUM)  ?fluticasone (FLONASE) ?oxyCODONE-acetaminophen (PERCOCET/ROXICET) ? ? ?As of today, STOP taking any Aspirin (unless otherwise instructed by your surgeon) Aleve, Naproxen, Ibuprofen, Motrin, Advil, Goody's, BC's, all herbal medications, fish oil, and all vitamins. ?         ?           ?Do NOT Smoke (Tobacco/Vaping) for 24 hours prior to your procedure. ? ?If you use a CPAP at night, you may bring your mask/headgear for your overnight stay. ?  ?Contacts, glasses, piercing's, hearing aid's, dentures or partials  may not be worn into surgery, please bring cases for these belongings.  ?  ?For patients admitted to the hospital, discharge time will be determined by your treatment team. ?  ?Patients discharged the day of surgery will not be allowed to drive home, and someone needs to stay with them for 24 hours. ? ?NO VISITORS WILL BE ALLOWED IN PRE-OP WHERE PATIENTS ARE PREPPED FOR SURGERY.  ONLY 1 SUPPORT PERSON MAY BE PRESENT IN THE WAITING ROOM WHILE YOU ARE IN SURGERY.  IF YOU ARE TO BE ADMITTED, ONCE YOU ARE IN YOUR ROOM YOU WILL BE ALLOWED TWO (2) VISITORS. (1) VISITOR MAY STAY OVERNIGHT BUT MUST ARRIVE TO THE ROOM BY 8pm.  Minor children may have two parents present. Special consideration for safety and communication needs will be reviewed on a case by case basis. ? ? ?Special instructions:   ?[facility]- Preparing For Surgery ? ?Before surgery, you can play an important role. Because skin is not sterile, your skin needs to be as free of germs as possible. You can reduce the number of germs on your skin by washing with CHG (chlorahexidine gluconate) Soap before surgery.  CHG is an antiseptic cleaner which kills germs and bonds with the skin to continue killing germs even after washing.   ? ?Oral Hygiene is also important to reduce your risk of infection.  Remember - BRUSH YOUR TEETH THE MORNING OF SURGERY WITH YOUR REGULAR TOOTHPASTE ? ?  Please do not use if you have an allergy to CHG or antibacterial soaps. If your skin becomes reddened/irritated stop using the CHG.  ?Do not shave (including legs and underarms) for at least 48 hours prior to first CHG shower. It is OK to shave your face. ? ?Please follow these instructions carefully. ?  ?Shower the NIGHT BEFORE SURGERY and the MORNING OF SURGERY ? ?If you chose to wash your hair, wash your hair first as usual with your normal shampoo. ? ?After you shampoo, rinse your hair and body thoroughly to remove the shampoo. ? ?Use CHG Soap as you would any other liquid soap. You  can apply CHG directly to the skin and wash gently with a scrungie or a clean washcloth.  ? ?Apply the CHG Soap to your body ONLY FROM THE NECK DOWN.  Do not use on open wounds or open sores. Avoid contact with your eyes, ears, mouth and genitals (private parts). Wash Face and genitals (private parts)  with your normal soap.  ? ?Wash thoroughly, paying special attention to the area where your surgery will be performed. ? ?Thoroughly rinse your body with warm water from the neck down. ? ?DO NOT shower/wash with your normal soap after using and rinsing off the CHG Soap. ? ?Pat yourself dry with a CLEAN TOWEL. ? ?Wear CLEAN PAJAMAS to bed the night before surgery ? ?Place CLEAN SHEETS on your bed the night before your surgery ? ?DO NOT SLEEP WITH PETS. ? ? ?Day of Surgery: ?Take a shower with CHG soap. ?Do not wear jewelry or makeup ?Do not wear lotions, powders, perfumes/colognes, or deodorant. ?Do not shave 48 hours prior to surgery.  Men may shave face and neck. ?Do not bring valuables to the hospital.  ?[facility] is not responsible for any belongings or valuables. ?Do not wear nail polish, gel polish, artificial nails, or any other type of covering on natural nails (fingers and toes) ?If you have artificial nails or gel coating that need to be removed by a nail salon, please have this removed prior to surgery. Artificial nails or gel coating may interfere with anesthesia's ability to adequately monitor your vital signs. ?Wear Clean/Comfortable clothing the morning of surgery ?Do not apply any deodorants/lotions.   ?Remember to brush your teeth WITH YOUR REGULAR TOOTHPASTE. ?  ?Please read over the following fact sheets that you were given. ? ? ?3 days prior to your procedure or After your COVID test ? ?? You are not required to quarantine however you are required to wear a well-fitting mask when you are out and around people not in your household. If your mask becomes wet or soiled, replace with a new one. ? ??  Wash your hands often with soap and water for 20 seconds or clean your hands with an alcohol-based hand sanitizer that contains at least 60% alcohol. ? ?? Do not share personal items. ? ?? Notify your provider: ? ?o if you are in close contact with someone who has COVID ? ?o or if you develop a fever of 100.4 or greater, sneezing, cough, sore throat, shortness of breath or body aches.   ?

## 2021-03-31 ENCOUNTER — Encounter (HOSPITAL_COMMUNITY)
Admission: RE | Admit: 2021-03-31 | Discharge: 2021-03-31 | Disposition: A | Discharge status: Home / Self Care | Payer: No Typology Code available for payment source | Source: Ambulatory Visit | Attending: Orthopedic Surgery | Admitting: Orthopedic Surgery

## 2021-03-31 ENCOUNTER — Encounter (HOSPITAL_COMMUNITY): Payer: Self-pay

## 2021-03-31 ENCOUNTER — Other Ambulatory Visit: Payer: Self-pay

## 2021-03-31 VITALS — BP 131/83 | HR 68 | Temp 98.1°F | Resp 18 | Ht 66.0 in | Wt 197.0 lb

## 2021-03-31 DIAGNOSIS — Z01818 Encounter for other preprocedural examination: Secondary | ICD-10-CM | POA: Diagnosis present

## 2021-03-31 HISTORY — DX: Hyperlipidemia, unspecified: E78.5

## 2021-03-31 HISTORY — DX: Allergy status to unspecified drugs, medicaments and biological substances: Z88.9

## 2021-03-31 HISTORY — DX: Dyspnea, unspecified: R06.00

## 2021-03-31 HISTORY — DX: Prediabetes: R73.03

## 2021-03-31 LAB — BASIC METABOLIC PANEL
Anion gap: 6 (ref 5–15)
BUN: 9 mg/dL (ref 6–20)
CO2: 30 mmol/L (ref 22–32)
Calcium: 9.5 mg/dL (ref 8.9–10.3)
Chloride: 101 mmol/L (ref 98–111)
Creatinine, Ser: 0.81 mg/dL (ref 0.44–1.00)
GFR, Estimated: >60 mL/min (ref >60)
Glucose, Bld: 93 mg/dL (ref 70–99)
Potassium: 3.6 mmol/L (ref 3.5–5.1)
Sodium: 137 mmol/L (ref 135–145)

## 2021-03-31 LAB — CBC
HCT: 40.7 % (ref 36.0–46.0)
Hemoglobin: 13.4 g/dL (ref 12.0–15.0)
MCH: 29.8 pg (ref 26.0–34.0)
MCHC: 32.9 g/dL (ref 30.0–36.0)
MCV: 90.4 fL (ref 80.0–100.0)
Platelets: 253 10*3/uL (ref 150–400)
RBC: 4.5 MIL/uL (ref 3.87–5.11)
RDW: 12.5 % (ref 11.5–15.5)
WBC: 5.2 10*3/uL (ref 4.0–10.5)
nRBC: 0 % (ref 0.0–0.2)

## 2021-03-31 NOTE — Progress Notes (Signed)
PCP - Dr Michell Heinrich ?Cardiologist - n/a ? ?Chest x-ray - n/a ?EKG - 03/31/21 ?Stress Test - n/a ?ECHO - n/a ?Cardiac Cath - n/a ? ?ICD Pacemaker/Loop - n/a ? ?Sleep Study -  n/a ?CPAP - none ? ?ERAS: Clear liquids til 8 am DOS.  Ensure drink given at PAT with instructions for DOS. ? ?STOP now taking any Aspirin (unless otherwise instructed by your surgeon), Aleve, Naproxen, Ibuprofen, Motrin, Advil, Goody's, BC's, all herbal medications, fish oil, and all vitamins.  ? ?Coronavirus Screening ?Covid test n/a Ambulatory Surgery  ?Do you have any of the following symptoms:  ?Cough yes/no: No ?Fever (>100.30F)  yes/no: No ?Runny nose yes/no: No ?Sore throat yes/no: No ?Difficulty breathing/shortness of breath  yes/no: No ? ?Have you traveled in the last 14 days and where? yes/no: No ? ?Patient verbalized understanding of instructions that were given to them at the PAT appointment. Patient was also instructed that they will need to review over the PAT instructions again at home before surgery. ? ?

## 2021-03-31 NOTE — Progress Notes (Deleted)
error 

## 2021-03-31 NOTE — Progress Notes (Signed)
error 

## 2021-04-08 ENCOUNTER — Encounter (HOSPITAL_COMMUNITY): Payer: Self-pay | Admitting: Orthopedic Surgery

## 2021-04-08 ENCOUNTER — Ambulatory Visit (HOSPITAL_COMMUNITY)
Admission: RE | Admit: 2021-04-08 | Discharge: 2021-04-08 | Disposition: A | Discharge status: Home / Self Care | Payer: No Typology Code available for payment source | Attending: Orthopedic Surgery | Admitting: Orthopedic Surgery

## 2021-04-08 ENCOUNTER — Ambulatory Visit (HOSPITAL_COMMUNITY): Payer: No Typology Code available for payment source | Admitting: Certified Registered Nurse Anesthetist

## 2021-04-08 ENCOUNTER — Encounter (HOSPITAL_COMMUNITY)
Admission: RE | Disposition: A | Discharge status: Home / Self Care | Payer: Self-pay | Source: Home / Self Care | Attending: Orthopedic Surgery

## 2021-04-08 ENCOUNTER — Ambulatory Visit (HOSPITAL_COMMUNITY): Payer: No Typology Code available for payment source

## 2021-04-08 ENCOUNTER — Ambulatory Visit (HOSPITAL_BASED_OUTPATIENT_CLINIC_OR_DEPARTMENT_OTHER): Payer: No Typology Code available for payment source | Admitting: Certified Registered Nurse Anesthetist

## 2021-04-08 DIAGNOSIS — F419 Anxiety disorder, unspecified: Secondary | ICD-10-CM | POA: Diagnosis not present

## 2021-04-08 DIAGNOSIS — M1990 Unspecified osteoarthritis, unspecified site: Secondary | ICD-10-CM | POA: Diagnosis not present

## 2021-04-08 DIAGNOSIS — F32A Depression, unspecified: Secondary | ICD-10-CM | POA: Insufficient documentation

## 2021-04-08 DIAGNOSIS — I10 Essential (primary) hypertension: Secondary | ICD-10-CM | POA: Diagnosis not present

## 2021-04-08 DIAGNOSIS — M533 Sacrococcygeal disorders, not elsewhere classified: Secondary | ICD-10-CM | POA: Insufficient documentation

## 2021-04-08 DIAGNOSIS — Z87891 Personal history of nicotine dependence: Secondary | ICD-10-CM | POA: Diagnosis not present

## 2021-04-08 DIAGNOSIS — D759 Disease of blood and blood-forming organs, unspecified: Secondary | ICD-10-CM | POA: Diagnosis not present

## 2021-04-08 HISTORY — PX: SACROILIAC JOINT FUSION: SHX6088

## 2021-04-08 LAB — SURGICAL PCR SCREEN
MRSA, PCR: NEGATIVE
Staphylococcus aureus: NEGATIVE

## 2021-04-08 SURGERY — SACROILIAC JOINT FUSION
Anesthesia: General | Site: Back | Laterality: Left

## 2021-04-08 MED ORDER — ACETAMINOPHEN 160 MG/5ML PO SOLN: 1000.0000 mg | Freq: Once | ORAL | Status: DC | PRN

## 2021-04-08 MED ORDER — EPHEDRINE SULFATE-NACL 50-0.9 MG/10ML-% IV SOSY
PREFILLED_SYRINGE | INTRAVENOUS | Status: DC | PRN
  Administered 2021-04-08 (×2): 10 mg via INTRAVENOUS

## 2021-04-08 MED ORDER — ACETAMINOPHEN 500 MG PO TABS: 1000.0000 mg | ORAL_TABLET | Freq: Once | ORAL | Status: DC | PRN

## 2021-04-08 MED ORDER — 0.9 % SODIUM CHLORIDE (POUR BTL) OPTIME
TOPICAL | Status: DC | PRN
  Administered 2021-04-08: 2000 mL

## 2021-04-08 MED ORDER — ACETAMINOPHEN 10 MG/ML IV SOLN
INTRAVENOUS | Status: AC
  Filled 2021-04-08: qty 100

## 2021-04-08 MED ORDER — PHENYLEPHRINE 40 MCG/ML (10ML) SYRINGE FOR IV PUSH (FOR BLOOD PRESSURE SUPPORT)
PREFILLED_SYRINGE | INTRAVENOUS | Status: AC
  Filled 2021-04-08: qty 10

## 2021-04-08 MED ORDER — FENTANYL CITRATE (PF) 250 MCG/5ML IJ SOLN
INTRAMUSCULAR | Status: AC
  Filled 2021-04-08: qty 5

## 2021-04-08 MED ORDER — DEXAMETHASONE SODIUM PHOSPHATE 10 MG/ML IJ SOLN
INTRAMUSCULAR | Status: AC
  Filled 2021-04-08: qty 1

## 2021-04-08 MED ORDER — SUGAMMADEX SODIUM 200 MG/2ML IV SOLN
INTRAVENOUS | Status: DC | PRN
  Administered 2021-04-08: 200 mg via INTRAVENOUS

## 2021-04-08 MED ORDER — METHOCARBAMOL 500 MG PO TABS: 500.0000 mg | ORAL_TABLET | Freq: Four times a day (QID) | ORAL | 0 refills | Status: AC | PRN

## 2021-04-08 MED ORDER — LIDOCAINE 2% (20 MG/ML) 5 ML SYRINGE
INTRAMUSCULAR | Status: DC | PRN
  Administered 2021-04-08: 60 mg via INTRAVENOUS

## 2021-04-08 MED ORDER — FENTANYL CITRATE (PF) 100 MCG/2ML IJ SOLN
INTRAMUSCULAR | Status: AC
  Filled 2021-04-08: qty 2

## 2021-04-08 MED ORDER — ONDANSETRON HCL 4 MG/2ML IJ SOLN
INTRAMUSCULAR | Status: AC
  Filled 2021-04-08: qty 2

## 2021-04-08 MED ORDER — BUPIVACAINE LIPOSOME 1.3 % IJ SUSP
INTRAMUSCULAR | Status: DC | PRN
  Administered 2021-04-08: 20 mL

## 2021-04-08 MED ORDER — HYDROCODONE-ACETAMINOPHEN 5-325 MG PO TABS: 1.0000 | ORAL_TABLET | Freq: Four times a day (QID) | ORAL | 0 refills | Status: AC | PRN

## 2021-04-08 MED ORDER — BUPIVACAINE LIPOSOME 1.3 % IJ SUSP
INTRAMUSCULAR | Status: AC
  Filled 2021-04-08: qty 20

## 2021-04-08 MED ORDER — CEFAZOLIN SODIUM-DEXTROSE 2-4 GM/100ML-% IV SOLN
2.0000 g | INTRAVENOUS | Status: AC
  Administered 2021-04-08: 2 g via INTRAVENOUS
  Filled 2021-04-08: qty 100

## 2021-04-08 MED ORDER — OXYCODONE HCL 5 MG PO TABS
ORAL_TABLET | ORAL | Status: AC
  Filled 2021-04-08: qty 1

## 2021-04-08 MED ORDER — CHLORHEXIDINE GLUCONATE 0.12 % MT SOLN
OROMUCOSAL | Status: AC
  Administered 2021-04-08: 15 mL
  Filled 2021-04-08: qty 15

## 2021-04-08 MED ORDER — BUPIVACAINE-EPINEPHRINE (PF) 0.25% -1:200000 IJ SOLN
INTRAMUSCULAR | Status: DC | PRN
  Administered 2021-04-08: 10 mL via PERINEURAL

## 2021-04-08 MED ORDER — ROCURONIUM BROMIDE 10 MG/ML (PF) SYRINGE
PREFILLED_SYRINGE | INTRAVENOUS | Status: AC
  Filled 2021-04-08: qty 10

## 2021-04-08 MED ORDER — DEXAMETHASONE SODIUM PHOSPHATE 10 MG/ML IJ SOLN
INTRAMUSCULAR | Status: DC | PRN
  Administered 2021-04-08: 10 mg via INTRAVENOUS

## 2021-04-08 MED ORDER — LACTATED RINGERS IV SOLN: INTRAVENOUS | Status: DC

## 2021-04-08 MED ORDER — POVIDONE-IODINE 10 % EX SWAB: 2.0000 "application " | Freq: Once | CUTANEOUS | Status: DC

## 2021-04-08 MED ORDER — PROPOFOL 10 MG/ML IV BOLUS
INTRAVENOUS | Status: DC | PRN
  Administered 2021-04-08: 130 mg via INTRAVENOUS

## 2021-04-08 MED ORDER — ACETAMINOPHEN 10 MG/ML IV SOLN
1000.0000 mg | Freq: Once | INTRAVENOUS | Status: DC | PRN
  Administered 2021-04-08: 1000 mg via INTRAVENOUS

## 2021-04-08 MED ORDER — ONDANSETRON HCL 4 MG/2ML IJ SOLN
INTRAMUSCULAR | Status: DC | PRN
Start: 2021-04-08 — End: 2021-04-08
  Administered 2021-04-08: 4 mg via INTRAVENOUS

## 2021-04-08 MED ORDER — MIDAZOLAM HCL 2 MG/2ML IJ SOLN
INTRAMUSCULAR | Status: AC
  Filled 2021-04-08: qty 2

## 2021-04-08 MED ORDER — ROCURONIUM BROMIDE 10 MG/ML (PF) SYRINGE
PREFILLED_SYRINGE | INTRAVENOUS | Status: DC | PRN
Start: 2021-04-08 — End: 2021-04-08
  Administered 2021-04-08: 80 mg via INTRAVENOUS

## 2021-04-08 MED ORDER — LIDOCAINE 2% (20 MG/ML) 5 ML SYRINGE
INTRAMUSCULAR | Status: AC
  Filled 2021-04-08: qty 5

## 2021-04-08 MED ORDER — PROPOFOL 10 MG/ML IV BOLUS
INTRAVENOUS | Status: AC
  Filled 2021-04-08: qty 20

## 2021-04-08 MED ORDER — OXYCODONE HCL 5 MG/5ML PO SOLN: 5.0000 mg | Freq: Once | ORAL | Status: AC | PRN

## 2021-04-08 MED ORDER — MIDAZOLAM HCL 2 MG/2ML IJ SOLN
INTRAMUSCULAR | Status: DC | PRN
Start: 2021-04-08 — End: 2021-04-08
  Administered 2021-04-08: 2 mg via INTRAVENOUS

## 2021-04-08 MED ORDER — FENTANYL CITRATE (PF) 100 MCG/2ML IJ SOLN
INTRAMUSCULAR | Status: DC | PRN
  Administered 2021-04-08: 50 ug via INTRAVENOUS
  Administered 2021-04-08: 100 ug via INTRAVENOUS

## 2021-04-08 MED ORDER — FENTANYL CITRATE (PF) 100 MCG/2ML IJ SOLN
25.0000 ug | INTRAMUSCULAR | Status: DC | PRN
  Administered 2021-04-08 (×3): 50 ug via INTRAVENOUS

## 2021-04-08 MED ORDER — CHLORHEXIDINE GLUCONATE 0.12 % MT SOLN
15.0000 mL | OROMUCOSAL | Status: AC
  Filled 2021-04-08: qty 15

## 2021-04-08 MED ORDER — OXYCODONE HCL 5 MG PO TABS
5.0000 mg | ORAL_TABLET | Freq: Once | ORAL | Status: AC | PRN
  Administered 2021-04-08: 5 mg via ORAL

## 2021-04-08 SURGICAL SUPPLY — 62 items
BAG COUNTER SPONGE SURGICOUNT (BAG) ×2 IMPLANT
BENZOIN TINCTURE PRP APPL 2/3 (GAUZE/BANDAGES/DRESSINGS) ×2 IMPLANT
BIT DRILL CANNULATED 7.0X3.2MM (BIT) IMPLANT
BLADE CLIPPER SURG (BLADE) ×2 IMPLANT
BLADE SURG 10 STRL SS (BLADE) ×2 IMPLANT
CANISTER SUCT 3000ML PPV (MISCELLANEOUS) ×2 IMPLANT
COVER SURGICAL LIGHT HANDLE (MISCELLANEOUS) ×4 IMPLANT
DRAPE C-ARM 42X72 X-RAY (DRAPES) ×2 IMPLANT
DRAPE C-ARMOR (DRAPES) ×2 IMPLANT
DRAPE INCISE IOBAN 66X45 STRL (DRAPES) ×2 IMPLANT
DRAPE POUCH INSTRU U-SHP 10X18 (DRAPES) ×2 IMPLANT
DRAPE SURG 17X23 STRL (DRAPES) ×6 IMPLANT
DRILL CANNULATED 7.0X3.2MM (BIT) ×2
DURAPREP 26ML APPLICATOR (WOUND CARE) ×2 IMPLANT
ELECT CAUTERY BLADE 6.4 (BLADE) ×2 IMPLANT
ELECT REM PT RETURN 9FT ADLT (ELECTROSURGICAL) ×2
ELECTRODE REM PT RTRN 9FT ADLT (ELECTROSURGICAL) ×1 IMPLANT
GAUZE 4X4 16PLY ~~LOC~~+RFID DBL (SPONGE) ×2 IMPLANT
GAUZE SPONGE 4X4 12PLY STRL (GAUZE/BANDAGES/DRESSINGS) ×2 IMPLANT
GLOVE SRG 8 PF TXTR STRL LF DI (GLOVE) ×1 IMPLANT
GLOVE SURG ENC MOIS LTX SZ6.5 (GLOVE) ×2 IMPLANT
GLOVE SURG ENC MOIS LTX SZ8 (GLOVE) ×2 IMPLANT
GLOVE SURG UNDER POLY LF SZ7 (GLOVE) ×2 IMPLANT
GLOVE SURG UNDER POLY LF SZ8 (GLOVE) ×1
GOWN STRL REUS W/ TWL LRG LVL3 (GOWN DISPOSABLE) ×2 IMPLANT
GOWN STRL REUS W/ TWL XL LVL3 (GOWN DISPOSABLE) ×1 IMPLANT
GOWN STRL REUS W/TWL LRG LVL3 (GOWN DISPOSABLE) ×2
GOWN STRL REUS W/TWL XL LVL3 (GOWN DISPOSABLE) ×1
IMPL IFUSE 7.0MMX55MM (Rod) IMPLANT
IMPLANT IFUSE 7.0MMX40MM (Rod) ×2 IMPLANT
IMPLANT IFUSE 7.0MMX55MM (Rod) ×2 IMPLANT
KIT BASIN OR (CUSTOM PROCEDURE TRAY) ×2 IMPLANT
KIT TURNOVER KIT B (KITS) ×2 IMPLANT
MANIFOLD NEPTUNE II (INSTRUMENTS) ×2 IMPLANT
NDL HYPO 25GX1X1/2 BEV (NEEDLE) ×1 IMPLANT
NEEDLE 22X1 1/2 (OR ONLY) (NEEDLE) ×2 IMPLANT
NEEDLE HYPO 25GX1X1/2 BEV (NEEDLE) ×2 IMPLANT
NS IRRIG 1000ML POUR BTL (IV SOLUTION) ×2 IMPLANT
PACK UNIVERSAL I (CUSTOM PROCEDURE TRAY) ×2 IMPLANT
PAD ARMBOARD 7.5X6 YLW CONV (MISCELLANEOUS) ×4 IMPLANT
PENCIL BUTTON HOLSTER BLD 10FT (ELECTRODE) ×2 IMPLANT
PIN PUSH 3.2MM (PIN) ×1 IMPLANT
PIN STEINMAN (PIN) ×1 IMPLANT
PIN STEINMAN 3.2MM (PIN) ×3 IMPLANT
SPONGE T-LAP 18X18 ~~LOC~~+RFID (SPONGE) ×2 IMPLANT
STAPLER VISISTAT 35W (STAPLE) ×2 IMPLANT
STRIP CLOSURE SKIN 1/2X4 (GAUZE/BANDAGES/DRESSINGS) ×2 IMPLANT
SUT MNCRL AB 4-0 PS2 18 (SUTURE) ×2 IMPLANT
SUT VIC AB 0 CT1 18XCR BRD 8 (SUTURE) IMPLANT
SUT VIC AB 0 CT1 8-18 (SUTURE)
SUT VIC AB 1 CT1 18XCR BRD 8 (SUTURE) ×1 IMPLANT
SUT VIC AB 1 CT1 8-18 (SUTURE) ×1
SUT VIC AB 2-0 CT2 18 VCP726D (SUTURE) ×2 IMPLANT
SYR BULB IRRIG 60ML STRL (SYRINGE) ×4 IMPLANT
SYR CONTROL 10ML LL (SYRINGE) ×2 IMPLANT
SYS SPNL FX3ANG 40X7X (Rod) ×2 IMPLANT
SYSTEM SPNL FX3ANG 40X7X (Rod) IMPLANT
TOWEL GREEN STERILE (TOWEL DISPOSABLE) ×4 IMPLANT
TOWEL GREEN STERILE FF (TOWEL DISPOSABLE) ×2 IMPLANT
TUBE CONNECTING 12X1/4 (SUCTIONS) ×2 IMPLANT
WATER STERILE IRR 1000ML POUR (IV SOLUTION) ×2 IMPLANT
YANKAUER SUCT BULB TIP NO VENT (SUCTIONS) ×2 IMPLANT

## 2021-04-08 NOTE — Op Note (Signed)
PATIENT NAME: Sherry Daniel  ? ?MEDICAL RECORD NO.:   [id number]  ?  ?DATE OF BIRTH: [birth year]  ?  ?DATE OF PROCEDURE: 04/08/2021  ?  ?                            OPERATIVE REPORT ?  ?  ?PREOPERATIVE DIAGNOSES:   Left sacroiliac joint dysfunction ?  ?POSTOPERATIVE DIAGNOSES: Left sacroiliac joint dysfunction ?  ?PROCEDURE: Minimally invasive left sacroiliac joint fusion ?  ?SURGEON:  Estill Bamberg, MD. ?  ?ASSISTANT:  Jason Coop, PA-C. ?  ?ANESTHESIA:  General endotracheal anesthesia. ?  ?COMPLICATIONS:  None. ?  ?DISPOSITION:  Stable. ?  ?ESTIMATED BLOOD LOSS:  Minimal. ?  ?INDICATIONS FOR SURGERY:  Briefly, [name] is a pleasant 51 year old ?female, who did present to me with ongoing severe pain at the left side of her low back.  Her work-up was diagnostic for left sacroiliac joint dysfunction.  She did fail appropriate conservative treatment measures, and as such, we did discuss proceeding with the surgery noted above.  The patient was made fully aware of the risks and recovery period associated with surgery, and she did wish to proceed. ?  ?OPERATIVE DETAILS:  On 04/08/2021, the patient was brought to ?surgery and general endotracheal anesthesia was administered.  The ?patient was placed prone on a flat Jackson bed, withdrawal was placed beneath the patient's chest and hips.  The region of the left buttock was prepped and draped in the usual sterile fashion.  A timeout procedure was performed.  Fluoroscopy was brought into the field.  I was able to ensure adequate lateral, inlet, and outlet radiographs.  At this point, a 3 cm incision was made in line with the posterior border of the sacrum on the left.  3 guidewires were advanced across the sacroiliac joint on the left side.  Fluoroscopy was liberally used while advancing the guidewires in order to ensure a safe trajectory of the guidewires..  I then drilled and broached over the guidewires.  At this point, 7 mm implants were advanced across the sacroiliac  joints from superiorly to inferiorly, the length of the implants were 55 mm, 40 mm, and 40 mm.  I was very pleased with the resting position of the implants on lateral, inlet, and outlet fluoroscopy.  The guidewires were then removed.  I was very pleased with the final lateral, inlet, and outlet radiographs.  At this point, the wound was copiously irrigated and closed in layers, using #1 Vicryl, followed by 2-0 Vicryl, followed by 4-0 Monocryl.  All instrument counts were correct at the termination of the procedure. ?  ?  ?Of note, Jason Coop was my assistant throughout surgery, and did aid ?in retraction, suctioning, and closure from start to finish. ?  ?  ?Estill Bamberg, MD  ?

## 2021-04-08 NOTE — Anesthesia Preprocedure Evaluation (Signed)
Anesthesia Evaluation  ?Patient identified by MRN, date of birth, ID band ?Patient awake ? ? ? ?Reviewed: ?Allergy & Precautions, NPO status , Patient's Chart, lab work & pertinent test results ? ?History of Anesthesia Complications ?Negative for: history of anesthetic complications ? ?Airway ?Mallampati: II ? ?TM Distance: >3 FB ?Neck ROM: Full ? ? ? Dental ? ?(+) Teeth Intact, Dental Advisory Given ?  ?Pulmonary ?shortness of breath, former smoker,  ?  ?breath sounds clear to auscultation ? ? ? ? ? ? Cardiovascular ?hypertension, Pt. on medications ?(-) angina(-) Past MI and (-) CHF  ?Rhythm:Regular  ? ?  ?Neuro/Psych ? Headaches, PSYCHIATRIC DISORDERS Anxiety Depression  Neuromuscular disease   ? GI/Hepatic ?negative GI ROS, Neg liver ROS,   ?Endo/Other  ?negative endocrine ROS ? Renal/GU ?Lab Results ?     Component                Value               Date                 ?     CREATININE               0.81                03/31/2021           ?Lab Results ?     Component                Value               Date                 ?     K                        3.6                 03/31/2021           ?  ? ?  ?Musculoskeletal ? ?(+) Arthritis ,  ? Abdominal ?  ?Peds ? Hematology ? ?(+) Blood dyscrasia, , Lab Results ?     Component                Value               Date                 ?     WBC                      5.2                 03/31/2021           ?     HGB                      13.4                03/31/2021           ?     HCT                      40.7                03/31/2021           ?     MCV  90.4                03/31/2021           ?     PLT                      253                 03/31/2021           ?   ?Anesthesia Other Findings ? ? Reproductive/Obstetrics ? ?  ? ? ? ? ? ? ? ? ? ? ? ? ? ?  ?  ? ? ? ? ? ? ? ? ?Anesthesia Physical ?Anesthesia Plan ? ?ASA: 2 ? ?Anesthesia Plan: General  ? ?Post-op Pain Management: Ofirmev IV (intra-op)* and  Toradol IV (intra-op)*  ? ?Induction: Intravenous ? ?PONV Risk Score and Plan: 3 and Dexamethasone and Ondansetron ? ?Airway Management Planned: Oral ETT ? ?Additional Equipment: None ? ?Intra-op Plan:  ? ?Post-operative Plan: Extubation in OR ? ?Informed Consent: I have reviewed the patients History and Physical, chart, labs and discussed the procedure including the risks, benefits and alternatives for the proposed anesthesia with the patient or authorized representative who has indicated his/her understanding and acceptance.  ? ? ? ?Dental advisory given ? ?Plan Discussed with: CRNA and Anesthesiologist ? ?Anesthesia Plan Comments:   ? ? ? ? ? ? ?Anesthesia Quick Evaluation ? ?

## 2021-04-08 NOTE — Transfer of Care (Signed)
Immediate Anesthesia Transfer of Care Note ? ?Patient: Sherry Daniel ? ?Procedure(s) Performed: LEFT SACROILIAC JOINT FUSION (Left: Back) ? ?Patient Location: PACU ? ?Anesthesia Type:General ? ?Level of Consciousness: awake, alert  and oriented ? ?Airway & Oxygen Therapy: Patient Spontanous Breathing and Patient connected to face mask oxygen ? ?Post-op Assessment: Report given to RN, Post -op Vital signs reviewed and stable and Patient moving all extremities ? ?Post vital signs: Reviewed and stable ? ?Last Vitals:  ?Vitals Value Taken Time  ?BP 123/64 04/08/21 1152  ?Temp    ?Pulse 75 04/08/21 1153  ?Resp 12 04/08/21 1153  ?SpO2 98 % 04/08/21 1153  ?Vitals shown include unvalidated device data. ? ?Last Pain:  ?Vitals:  ? 04/08/21 0821  ?TempSrc:   ?PainSc: 0-No pain  ?   ? ?  ? ?Complications: No notable events documented. ?

## 2021-04-08 NOTE — Anesthesia Procedure Notes (Signed)
Procedure Name: Intubation ?Date/Time: 04/08/2021 10:37 AM ?Performed by: Genelle Bal, CRNA ?Pre-anesthesia Checklist: Patient identified, Emergency Drugs available, Suction available and Patient being monitored ?Patient Re-evaluated:Patient Re-evaluated prior to induction ?Oxygen Delivery Method: Circle system utilized ?Preoxygenation: Pre-oxygenation with 100% oxygen ?Induction Type: IV induction ?Ventilation: Oral airway inserted - appropriate to patient size and Mask ventilation without difficulty ?Laryngoscope Size: Sabra Heck and 2 ?Grade View: Grade II ?Tube type: Oral ?Tube size: 7.0 mm ?Number of attempts: 1 ?Airway Equipment and Method: Stylet and Oral airway ?Placement Confirmation: ETT inserted through vocal cords under direct vision, positive ETCO2 and breath sounds checked- equal and bilateral ?Secured at: 22 cm ?Tube secured with: Tape ?Dental Injury: Teeth and Oropharynx as per pre-operative assessment  ? ? ? ? ?

## 2021-04-08 NOTE — H&P (Signed)
? ? ? ?PREOPERATIVE H&P ? ?Chief Complaint: Left-sided low back pain ? ?HPI: ?Sherry Daniel is a 51 y.o. female who presents with ongoing pain in the left low back ? ?Patient's examination and history is highly suggestive of left sacroiliac joint dysfunction. ? ?Patient has failed multiple forms of conservative care and continues to have pain (see office notes for additional details regarding the patient's full course of treatment) ? ?Past Medical History:  ?Diagnosis Date  ? Anxiety   ? DDD (degenerative disc disease), cervical   ? Depression   ? Dyspnea   ? with exertion and when sick- use inhaler prn  ? Headache   ? HLD (hyperlipidemia)   ? diet controlled  ? Hx of seasonal allergies   ? Hypertension   ? Palpitations   ? Pre-diabetes   ? diet controlled  ? ?Past Surgical History:  ?Procedure Laterality Date  ? CERVICAL DISCECTOMY    ? LUMBAR FUSION    ? ORBITAL FRACTURE SURGERY Right   ? POSTERIOR CERVICAL FUSION/FORAMINOTOMY N/A 11/01/2017  ? Procedure: POSTERIOR CERVICAL DECOMPRESSION FUSION, CERVICAL 3-4, CERVICAL 4-5, CERVICAL 5-6, CERVICAL 6-7 WITH INSTRUMENTATION AND ALLOGRAFT;  Surgeon: Estill Bamberg, MD;  Location: [facility] OR;  Service: Orthopedics;  Laterality: N/A;  ? TRANSFORAMINAL LUMBAR INTERBODY FUSION (TLIF) WITH PEDICLE SCREW FIXATION 1 LEVEL Right 10/04/2018  ? Procedure: RIGHT-SIDED LUMBAR FOUR-FIVE TRANSFORAMINAL LUMBAR INTERBODY FUSION WITH INSTRUMENTATION AND ALLOGRAFT;  Surgeon: Estill Bamberg, MD;  Location: [facility] OR;  Service: Orthopedics;  Laterality: Right;  ? UTERINE FIBROID SURGERY    ? WISDOM TOOTH EXTRACTION    ? ?Social History  ? ?Socioeconomic History  ? Marital status: Single  ?  Spouse name: Not on file  ? Number of children: Not on file  ? Years of education: Not on file  ? Highest education level: Not on file  ?Occupational History  ? Not on file  ?Tobacco Use  ? Smoking status: Former  ?  Types: Cigarettes  ?  Quit date: 09/24/2017  ?  Years since quitting: 3.5  ? Smokeless tobacco:  Never  ? Tobacco comments:  ?  reports she smoked mainly on the weekend socially  ?Vaping Use  ? Vaping Use: Never used  ?Substance and Sexual Activity  ? Alcohol use: Not Currently  ?  Comment: 1 to 2 days a month socially   ? Drug use: Never  ? Sexual activity: Yes  ?  Birth control/protection: Post-menopausal  ?Other Topics Concern  ? Not on file  ?Social History Narrative  ? Not on file  ? ?Social Determinants of Health  ? ?Financial Resource Strain: Not on file  ?Food Insecurity: Not on file  ?Transportation Needs: Not on file  ?Physical Activity: Not on file  ?Stress: Not on file  ?Social Connections: Not on file  ? ?No family history on file. ?No Known Allergies ?Prior to Admission medications   ?Medication Sig Start Date End Date Taking? Authorizing Provider  ?albuterol (VENTOLIN HFA) 108 (90 Base) MCG/ACT inhaler Inhale 1-2 puffs into the lungs every 6 (six) hours as needed for wheezing or shortness of breath.   Yes [provider]  ?APPLE CIDER VINEGAR PO Take 1 capsule by mouth daily.   Yes [provider]  ?Biotin 70350 MCG TABS Take 10,000 mcg by mouth daily.   Yes [provider]  ?buPROPion (WELLBUTRIN XL) 150 MG 24 hr tablet Take 150 mg by mouth daily. 08/17/18  Yes [provider]  ?cholecalciferol (VITAMIN D) 1000 units tablet  Take 1,000 Units by mouth daily.   Yes [provider]  ?citalopram (CELEXA) 40 MG tablet Take 40 mg by mouth daily.  09/21/17  Yes [provider]  ?cyclobenzaprine (FLEXERIL) 5 MG tablet Take 5 mg by mouth at bedtime as needed for muscle spasms.   Yes [provider]  ?Diphenhydramine-Pseudoephed (BENADRYL DECONGESTANT PO) Take 2 tablets by mouth daily as needed (congestion).   Yes [provider]  ?fluticasone (FLONASE) 50 MCG/ACT nasal spray Place 1 spray into both nostrils daily as needed for allergies or rhinitis.   Yes [provider]  ?losartan-hydrochlorothiazide (HYZAAR) 100-12.5 MG  tablet Take 1 tablet by mouth daily.   Yes [provider]  ?Magnesium 250 MG TABS Take 250 mg by mouth daily.   Yes [provider]  ?Multiple Vitamin (MULTIVITAMIN WITH MINERALS) TABS tablet Take 1 tablet by mouth daily.   Yes [provider]  ?phentermine 37.5 MG capsule Take 37.5 mg by mouth every morning. 03/09/21  Yes [provider]  ?Specialty Vitamins Products (VITAMINS FOR HAIR PO) Take 2 tablets by mouth daily.   Yes [provider]  ?vitamin B-12 (CYANOCOBALAMIN) 500 MCG tablet Take 500 mcg by mouth daily.    Yes [provider]  ?vitamin E 200 UNIT capsule Take 200 Units by mouth daily.    Yes [provider]  ?oxyCODONE-acetaminophen (PERCOCET/ROXICET) 5-325 MG tablet Take 1-2 tablets by mouth every 4 (four) hours as needed for moderate pain or severe pain. ?Patient not taking: Reported on 03/31/2021 10/05/18   Estill Bamberg, MD  ? ? ? ?All other systems have been reviewed and were otherwise negative with the exception of those mentioned in the HPI and as above. ? ?Physical Exam: ?There were no vitals filed for this visit. ? ?There is no height or weight on file to calculate BMI. ? ?General: Alert, no acute distress ?Cardiovascular: No pedal edema ?Respiratory: No cyanosis, no use of accessory musculature ?Skin: No lesions in the area of chief complaint ?Neurologic: Sensation intact distally ?Psychiatric: Patient is competent for consent with normal mood and affect ?Lymphatic: No axillary or cervical lymphadenopathy ? ? ?Assessment/Plan: ?Ongoing left sacroiliac joint dysfunction ?Plan for Procedure(s): ?LEFT SACROILIAC JOINT FUSION ? ? ?Jackelyn Hoehn, MD ?04/08/2021 ?7:42 AM  ?

## 2021-04-09 ENCOUNTER — Encounter (HOSPITAL_COMMUNITY): Payer: Self-pay | Admitting: Orthopedic Surgery

## 2021-04-14 NOTE — Anesthesia Postprocedure Evaluation (Signed)
Anesthesia Post Note ? ?Patient: Trenell Moxey ? ?Procedure(s) Performed: LEFT SACROILIAC JOINT FUSION (Left: Back) ? ?  ? ?Patient location during evaluation: PACU ?Anesthesia Type: General ?Level of consciousness: awake and alert ?Pain management: pain level controlled ?Vital Signs Assessment: post-procedure vital signs reviewed and stable ?Respiratory status: spontaneous breathing, nonlabored ventilation, respiratory function stable and patient connected to nasal cannula oxygen ?Cardiovascular status: blood pressure returned to baseline and stable ?Postop Assessment: no apparent nausea or vomiting ?Anesthetic complications: no ? ? ?No notable events documented. ? ?Last Vitals:  ?Vitals:  ? 04/08/21 1252 04/08/21 1307  ?BP: 116/67 118/79  ?Pulse: 74 75  ?Resp: 13 13  ?Temp: 36.6 ?C   ?SpO2: 100% 100%  ?  ?Last Pain:  ?Vitals:  ? 04/08/21 1250  ?TempSrc:   ?PainSc: 4   ? ? ?  ?  ?  ?  ?  ?  ? ?Murial Beam ? ? ? ? ?

## 2021-11-15 IMAGING — CT CT BIOPSY
4 of 8 series · 10 of 32 positions shown, 15 images · non-contrast
Comparison: none

CLINICAL DATA: 50-year-old female with history of left lower back
pain, suspected sacroiliac etiology.

[Series 2: needle -guided injection · axial · 0.80mm/px · z∈[-153,-105]mm · 4 of 41 slices shown, 9 images (1 of 4)]
[im 9/41  soft-tissue]
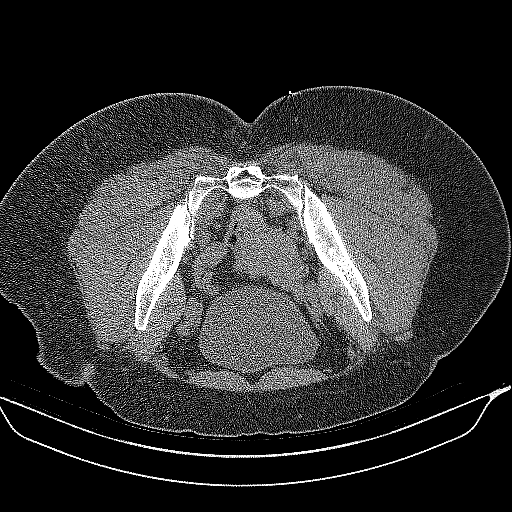
[im 9/41  lung]
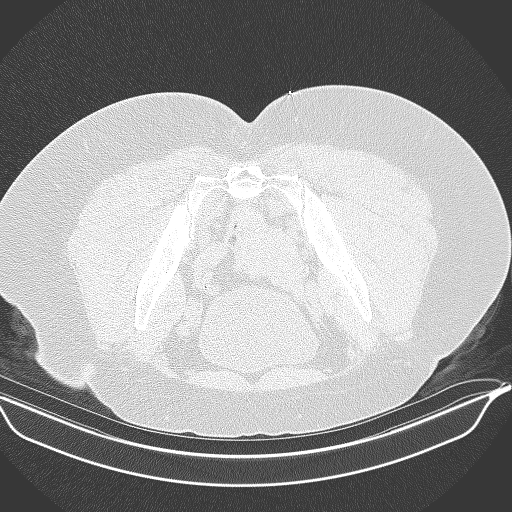
[im 9/41  bone]
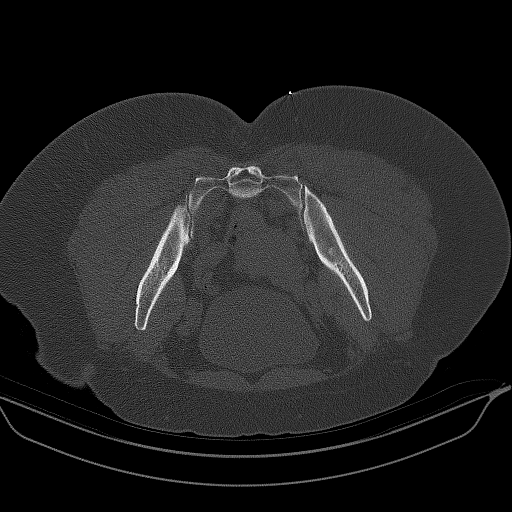
[im 17/41  soft-tissue]
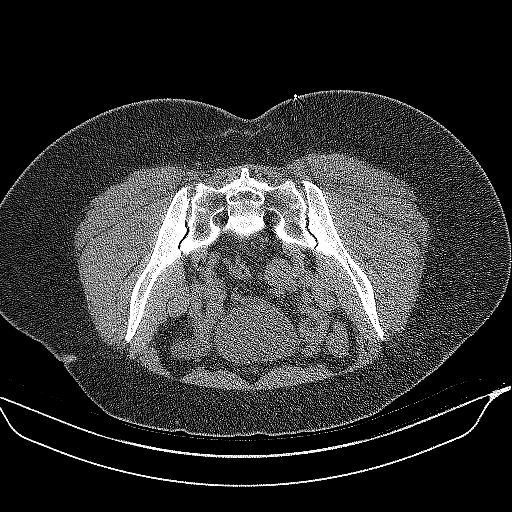
[im 17/41  lung]
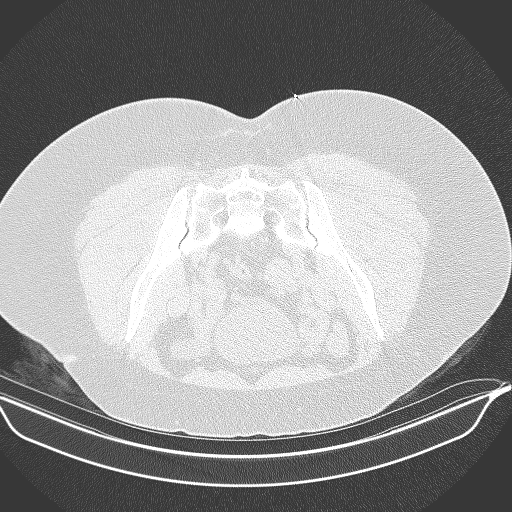
[im 25/41  soft-tissue]
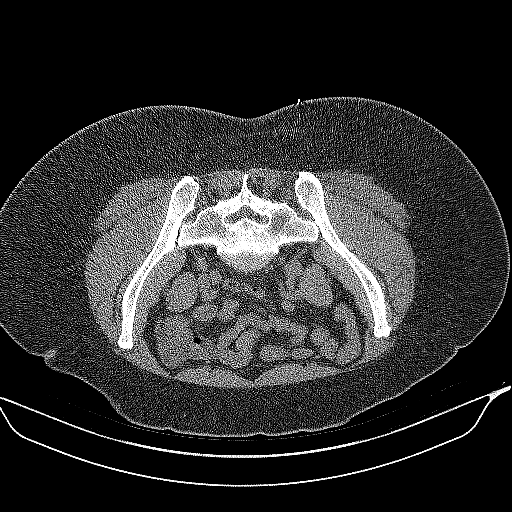
[im 25/41  lung]
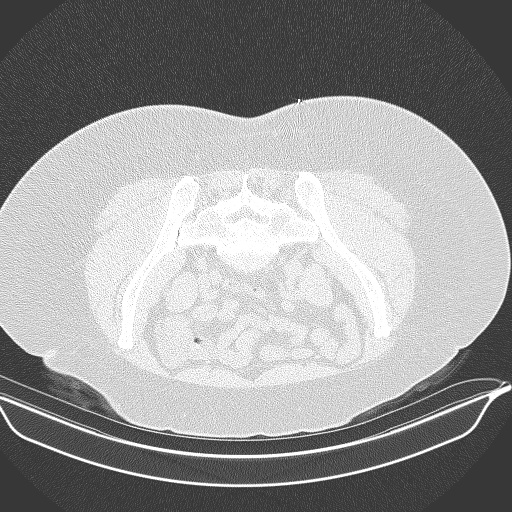
[im 33/41  soft-tissue]
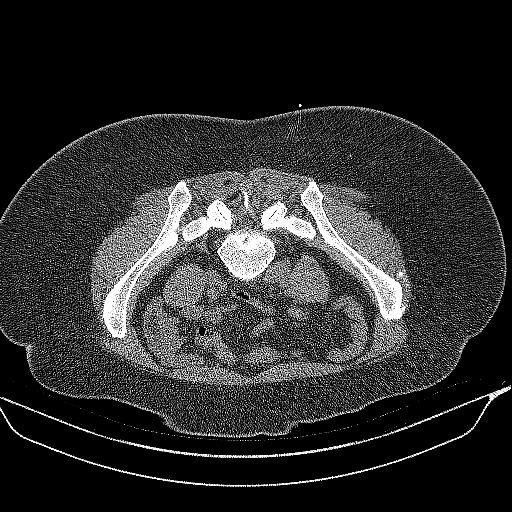
[im 33/41  lung]
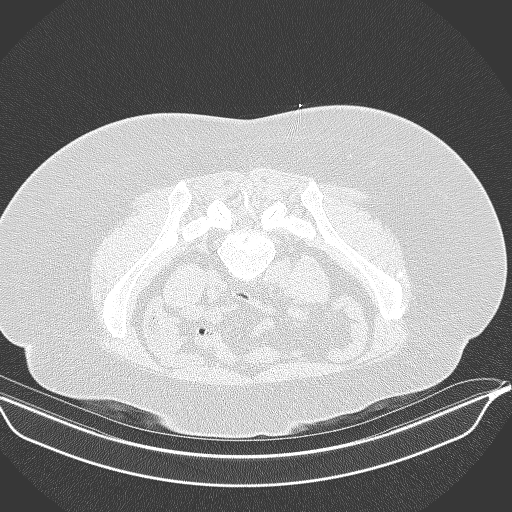

[Series 5: needle -guided injection · axial · 0.80mm/px · z∈[-151,-137]mm · 2 of 21 slices shown (2 of 4)]
[im 7/21  soft-tissue]
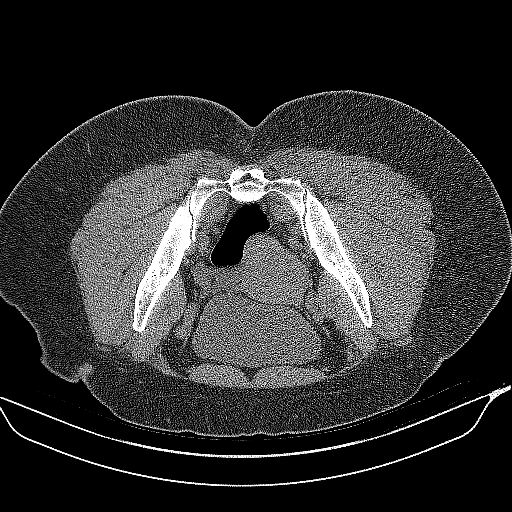
[im 14/21  soft-tissue]
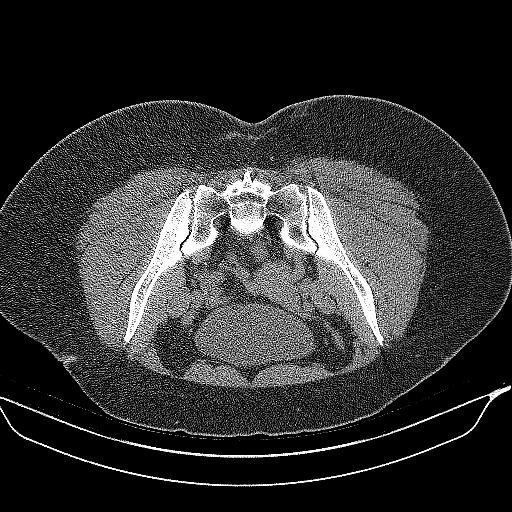

[Series 6: needle -guided injection · axial · 0.80mm/px · z∈[-151,-137]mm · 2 of 21 slices shown (3 of 4)]
[im 7/21  soft-tissue]
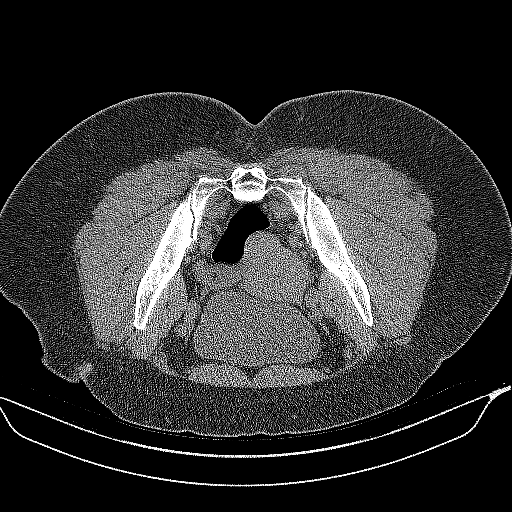
[im 14/21  soft-tissue]
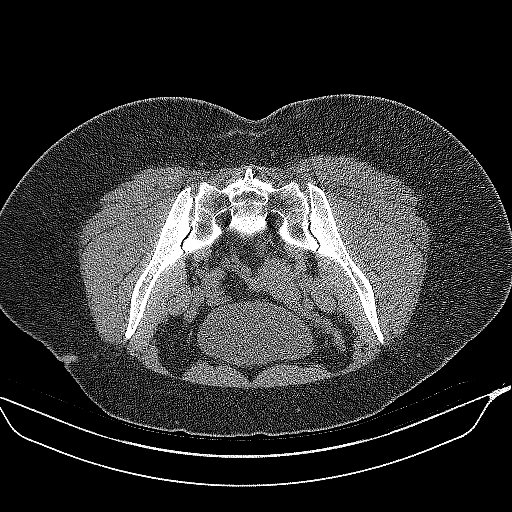

[Series 7: needle -guided injection · axial · 0.80mm/px · z∈[-151,-137]mm · 2 of 21 slices shown (4 of 4)]
[im 7/21  soft-tissue]
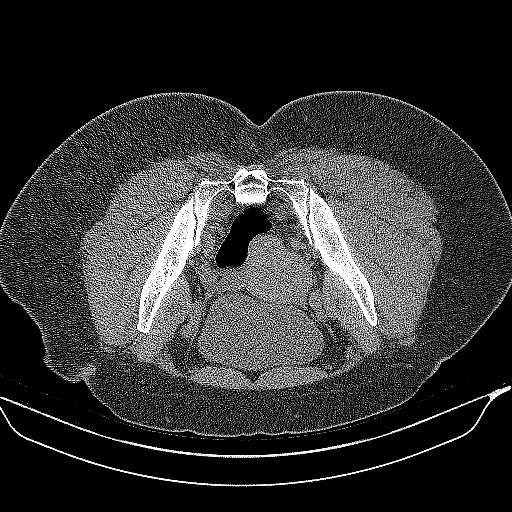
[im 14/21  soft-tissue]
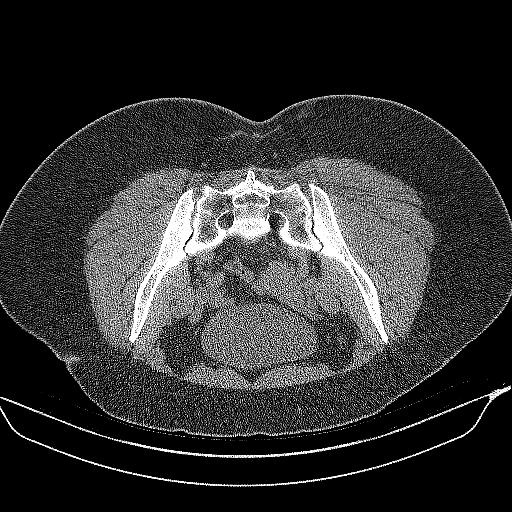

[10 of 32 positions shown; findings below may reference images not displayed]

FLUOROSCOPY TIME:  None.

PROCEDURE:
Left SI JOINT INJECTION.

After a thorough discussion of risks and benefits of the procedure,
including bleeding, infection, injury to nerves, blood vessels, and
adjacent structures, verbal and written consent was obtained.
Specific risks of the procedure included
nondiagnostic/nontherapeutic injection and non target injection. The
patient was placed prone on the fluoroscopy table and localization
was performed over the sacrum. Target site marked using fluoroscopic
guidance. The skin was prepped and draped in the usual sterile
fashion using Betadine soap.

After local anesthesia with 1% lidocaine without epinephrine and
subsequent deep anesthesia, a 3.5 inch 22 gauge spinal needle was
advanced into the left SI joint. Injection of 0.5 ml Isovue-M 200
confirmed intra-articular placement. No vascular uptake present.
Subsequently, 2 mL of 5% bupivacaine was injected into SI joint.
Needle was removed and a sterile dressing applied.

No complications were observed. The patient was observed and
released under the care of a driver after 30 minutes.
IMPRESSION: Successful fluoroscopically guided left SI joint injection, local
anesthetic only.

## 2022-12-12 IMAGING — RF DG SI JOINTS 3+V
1 series · 3 of 3 positions shown · non-contrast
Comparison: Lumbar spine radiographs 10/04/2018

CLINICAL DATA: Left sacroiliac joint effusion.

EXAM:
BILATERAL SACROILIAC JOINTS - 3+ VIEW

[Series 1: run · 3 of 3 slices shown]
[im 1/3]
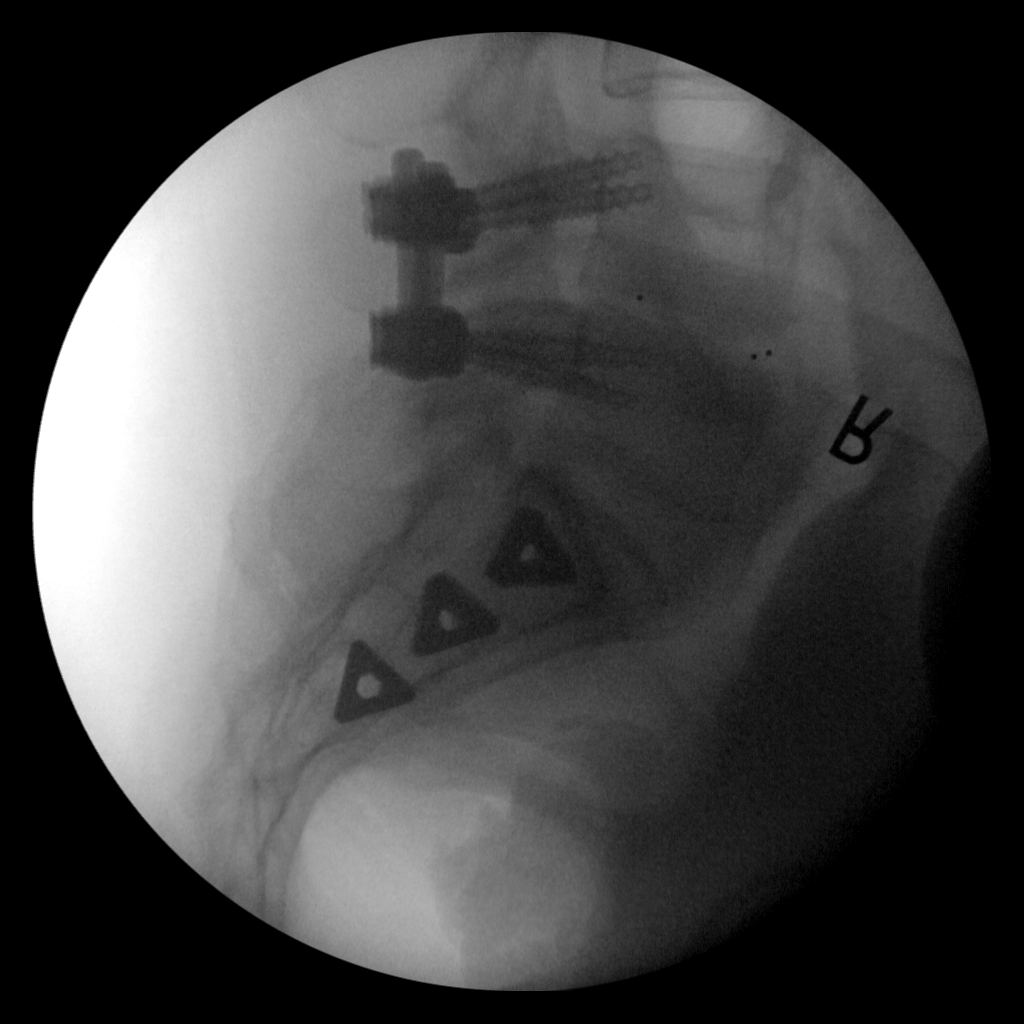
[im 2/3]
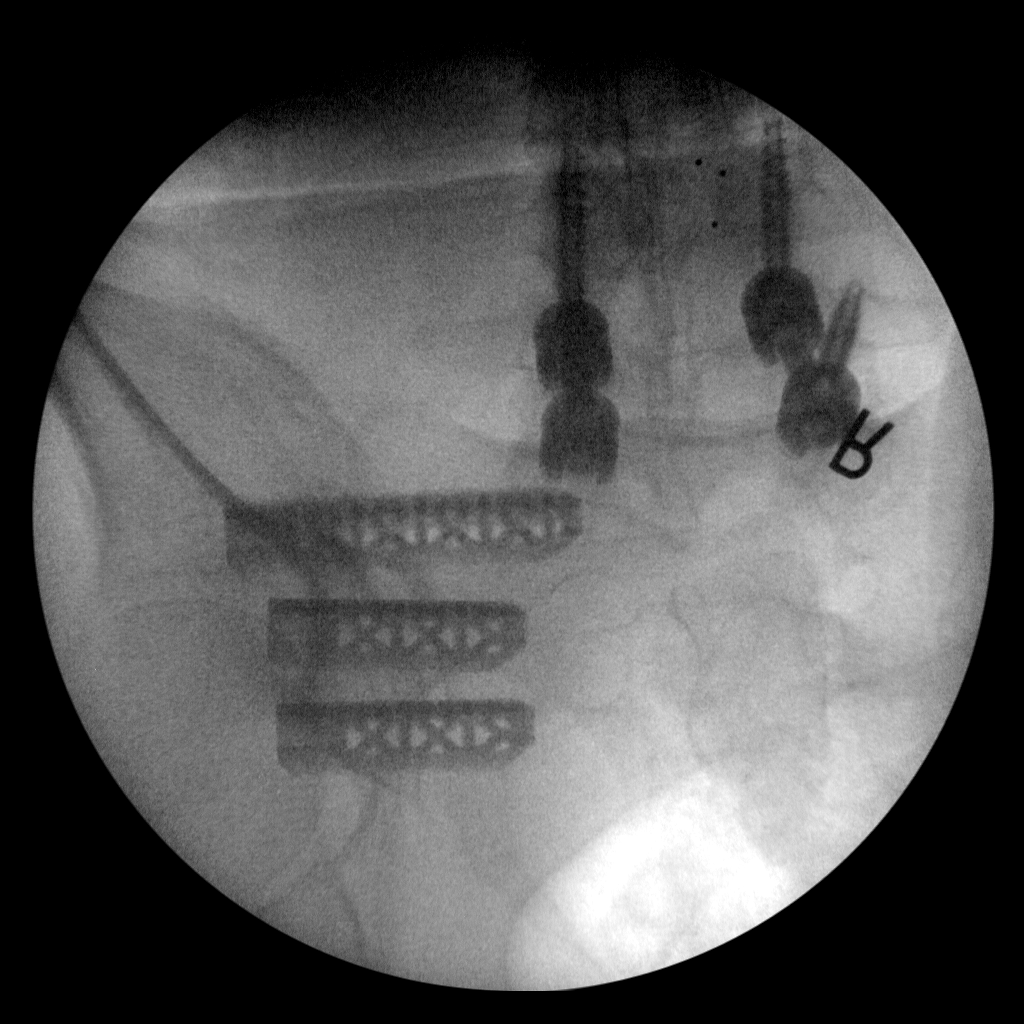
[im 3/3]
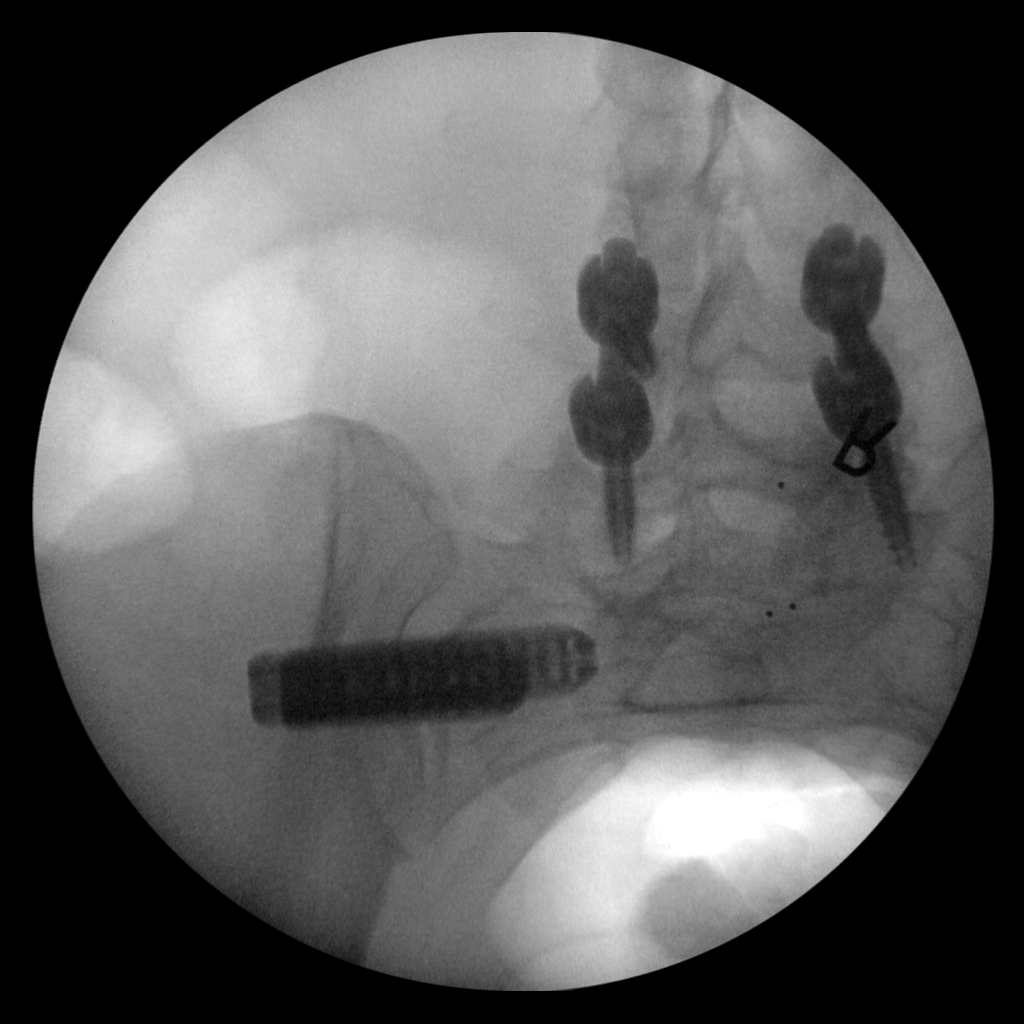

[3 of 3 positions shown; findings below may reference images not displayed]

FINDINGS: C-arm fluoroscopy was provided in the operating [HOSPITAL] seconds of
fluoroscopy time. 121.73 mGy.

3 spot fluoroscopic images of the left sacroiliac joint were
obtained following the procedure. There are 3 metallic implants
traversing the left sacroiliac joint which appear in good position.
Patient is status post previous L4-5 fusion. No complications are
identified.
IMPRESSION: Intraoperative views following surgical fusion of the left
sacroiliac joint. No demonstrated complications.
